# Patient Record
Sex: Male | Born: 1975 | Race: White | Hispanic: No | Marital: Married | Smoking: Never smoker
Health system: Southern US, Community
[De-identification: ages and names within clinical notes are randomized; demographics above are authoritative.]

## PROBLEM LIST (undated history)

## (undated) HISTORY — PX: NASAL SEPTUM SURGERY: SHX37

## (undated) HISTORY — PX: VASECTOMY: SHX75

## (undated) HISTORY — PX: OTHER SURGICAL HISTORY: SHX169

---

## 2014-05-12 ENCOUNTER — Emergency Department
Admission: EM | Admit: 2014-05-12 | Discharge: 2014-05-12 | Disposition: A | Discharge status: Home / Self Care | Payer: BC Managed Care – PPO | Source: Home / Self Care | Attending: Family Medicine | Admitting: Family Medicine

## 2014-05-12 ENCOUNTER — Emergency Department (INDEPENDENT_AMBULATORY_CARE_PROVIDER_SITE_OTHER): Payer: BC Managed Care – PPO

## 2014-05-12 ENCOUNTER — Telehealth: Payer: Self-pay | Admitting: *Deleted

## 2014-05-12 ENCOUNTER — Encounter: Payer: Self-pay | Admitting: Emergency Medicine

## 2014-05-12 DIAGNOSIS — M4040 Postural lordosis, site unspecified: Secondary | ICD-10-CM

## 2014-05-12 DIAGNOSIS — M25519 Pain in unspecified shoulder: Secondary | ICD-10-CM

## 2014-05-12 DIAGNOSIS — M5412 Radiculopathy, cervical region: Secondary | ICD-10-CM

## 2014-05-12 DIAGNOSIS — M2180 Other specified acquired deformities of unspecified limb: Secondary | ICD-10-CM

## 2014-05-12 DIAGNOSIS — M542 Cervicalgia: Secondary | ICD-10-CM

## 2014-05-12 DIAGNOSIS — R079 Chest pain, unspecified: Secondary | ICD-10-CM

## 2014-05-12 DIAGNOSIS — M5380 Other specified dorsopathies, site unspecified: Secondary | ICD-10-CM

## 2014-05-12 DIAGNOSIS — Z8781 Personal history of (healed) traumatic fracture: Secondary | ICD-10-CM

## 2014-05-12 MED ORDER — METAXALONE 800 MG PO TABS: 800.0000 mg | ORAL_TABLET | Freq: Three times a day (TID) | ORAL | Status: DC

## 2014-05-12 MED ORDER — HYDROCODONE-ACETAMINOPHEN 5-325 MG PO TABS: ORAL_TABLET | ORAL | Status: DC

## 2014-05-12 MED ORDER — PREDNISONE 20 MG PO TABS: 20.0000 mg | ORAL_TABLET | Freq: Two times a day (BID) | ORAL | Status: DC

## 2014-05-12 NOTE — Discharge Instructions (Signed)
Apply ice pack to right neck/shoulder 2 to 3 times daily.   Cervical Radiculopathy Cervical radiculopathy happens when a nerve in the neck is pinched or bruised by a slipped (herniated) disk or by arthritic changes in the bones of the cervical spine. This can occur due to an injury or as part of the normal aging process. Pressure on the cervical nerves can cause pain or numbness that runs from your neck all the way down into your arm and fingers. CAUSES  There are many possible causes, including:  Injury.  Muscle tightness in the neck from overuse.  Swollen, painful joints (arthritis).  Breakdown or degeneration in the bones and joints of the spine (spondylosis) due to aging.  Bone spurs that may develop near the cervical nerves. SYMPTOMS  Symptoms include pain, weakness, or numbness in the affected arm and hand. Pain can be severe or irritating. Symptoms may be worse when extending or turning the neck. DIAGNOSIS  Your caregiver will ask about your symptoms and do a physical exam. He or she may test your strength and reflexes. X-rays, CT scans, and MRI scans may be needed in cases of injury or if the symptoms do not go away after a period of time. Electromyography (EMG) or nerve conduction testing may be done to study how your nerves and muscles are working. TREATMENT  Your caregiver may recommend certain exercises to help relieve your symptoms. Cervical radiculopathy can, and often does, get better with time and treatment. If your problems continue, treatment options may include:  Wearing a soft collar for short periods of time.  Physical therapy to strengthen the neck muscles.  Medicines, such as nonsteroidal anti-inflammatory drugs (NSAIDs), oral corticosteroids, or spinal injections.  Surgery. Different types of surgery may be done depending on the cause of your problems. HOME CARE INSTRUCTIONS   Put ice on the affected area.  Put ice in a plastic bag.  Place a towel between  your skin and the bag.  Leave the ice on for 15-20 minutes, 03-04 times a day or as directed by your caregiver.  If ice does not help, you can try using heat. Take a warm shower or bath, or use a hot water bottle as directed by your caregiver.  You may try a gentle neck and shoulder massage.  Use a flat pillow when you sleep.  Only take over-the-counter or prescription medicines for pain, discomfort, or fever as directed by your caregiver.  If physical therapy was prescribed, follow your caregiver's directions.  If a soft collar was prescribed, use it as directed. SEEK IMMEDIATE MEDICAL CARE IF:   Your pain gets much worse and cannot be controlled with medicines.  You have weakness or numbness in your hand, arm, face, or leg.  You have a high fever or a stiff, rigid neck.  You lose bowel or bladder control (incontinence).  You have trouble with walking, balance, or speaking. MAKE SURE YOU:   Understand these instructions.  Will watch your condition.  Will get help right away if you are not doing well or get worse. Document Released: 08/21/2001 Document Revised: 02/18/2012 Document Reviewed: 07/10/2011 [facility] Patient Information 2014 [place], [place].

## 2014-05-12 NOTE — ED Notes (Signed)
Right shoulder pain x 1 month, radiates to back and right chest. Has tried chiropractor, PT, family doctor prescribed baclofen, no relief, can't get comfortable to sleep. 10/10

## 2014-05-12 NOTE — ED Provider Notes (Signed)
CSN: [id number]     Arrival date & time 05/12/14  1342 History   First MD Initiated Contact with Patient 05/12/14 1401     Chief Complaint  Patient presents with  . Shoulder Pain      HPI Comments: Patient awoke about one month ago with stiffness and pain in his right neck that has persisted and now radiates to his right shoulder. He recalls no injury or significant change in physical activities.  He visited his chiropracter twice without improvement.  He visited his PCP who prescribed baclofen, also resulting in no improvement.  He has pain when he flexes his neck laterally to the right, and over the past several days the pain has radiated to his right upper anterior chest and right arm.  He has intermittent tingling in his right first and second fingers.  He has difficulty sleeping.  He notes that he has had a dry cough for about two weeks.  No fevers, chills, and sweats.  He is not a smoker.  Patient is a 38 y.o. male presenting with shoulder pain. The history is provided by the patient.  Shoulder Pain This is a new problem. Episode onset: one month ago. The problem occurs constantly. The problem has been gradually worsening. Pertinent negatives include no headaches and no shortness of breath. Associated symptoms comments: Right neck pain and stiffness.  Cough.. Exacerbated by: flexing neck laterally toe the right. Nothing relieves the symptoms. Treatments tried: baclofen. The treatment provided no relief.    History reviewed. No pertinent past medical history. Past Surgical History  Procedure Laterality Date  . Nasal septum surgery     No family history on file. History  Substance Use Topics  . Smoking status: Never Smoker   . Smokeless tobacco: Not on file  . Alcohol Use: No    Review of Systems  Constitutional: Positive for fatigue. Negative for fever, chills and unexpected weight change.  HENT: Negative.   Eyes: Negative.   Respiratory: Positive for cough. Negative for shortness  of breath and wheezing.   Cardiovascular: Negative.   Gastrointestinal: Negative.   Genitourinary: Negative.   Musculoskeletal: Positive for neck pain.  Skin: Negative.   Neurological: Negative for headaches.    Allergies  Review of patient's allergies indicates no known allergies.  Home Medications   Prior to Admission medications   Medication Sig Start Date End Date Taking? Authorizing Provider  baclofen (LIORESAL) 10 MG tablet Take 10 mg by mouth 3 (three) times daily.   Yes Historical Provider, MD  HYDROcodone-acetaminophen (NORCO/VICODIN) 5-325 MG per tablet Take one by mouth at bedtime as needed for pain 05/12/14   Lattie HawStephen A Beese, MD  metaxalone (SKELAXIN) 800 MG tablet Take 1 tablet (800 mg total) by mouth 3 (three) times daily. 05/12/14   Lattie HawStephen A Beese, MD  predniSONE (DELTASONE) 20 MG tablet Take 1 tablet (20 mg total) by mouth 2 (two) times daily. Take with food. 05/12/14   Lattie HawStephen A Beese, MD   BP 128/79  Pulse 62  Temp(Src) 98.4 F (36.9 C) (Oral)  Ht 5\' 10"  (1.778 m)  Wt 181 lb (82.101 kg)  BMI 25.97 kg/m2  SpO2 99% Physical Exam  Nursing note and vitals reviewed. Constitutional: He is oriented to person, place, and time. He appears well-developed and well-nourished. No distress.  HENT:  Head: Normocephalic and atraumatic.  Nose: Nose normal.  Mouth/Throat: Oropharynx is clear and moist.  Eyes: Conjunctivae are normal. Pupils are equal, round, and reactive to light.  Neck:  Neck supple.    There is tenderness to palpation over the right neck and trapezius muscle as noted on diagram. Distal neurovascular function is intact.     Cardiovascular: Normal heart sounds.   Abdominal: There is no tenderness.  Musculoskeletal:       Right shoulder: He exhibits normal range of motion, no tenderness, no bony tenderness, no crepitus, normal pulse and normal strength.       Cervical back: He exhibits decreased range of motion and tenderness. He exhibits no bony tenderness.    Lymphadenopathy:    He has no cervical adenopathy.  Neurological: He is alert and oriented to person, place, and time.  Skin: Skin is warm and dry. No rash noted.    ED Course  Procedures  none     Imaging Review Dg Chest 2 View  05/12/2014   CLINICAL DATA:  Chest pain  EXAM: CHEST  2 VIEW  COMPARISON:  None.  FINDINGS: The lungs are clear. Heart size and pulmonary vascularity are normal. No adenopathy. No pneumothorax. There is evidence of an old fracture of the right clavicle with extensive remodeling in this area.  IMPRESSION: No edema or consolidation.   Electronically Signed   By: Bretta Bang M.D.   On: 05/12/2014 15:12   Dg Cervical Spine Complete  05/12/2014   CLINICAL DATA:  Right shoulder neck pain  EXAM: CERVICAL SPINE  4+ VIEWS  COMPARISON:  None.  FINDINGS: There is reversal normal cervical lordosis. No loss of vertebral body height. There is mild endplate spurring at C5-C6. Normal spinal laminal line. No subluxation. Open mouth odontoid view demonstrates normal alignment of the lateral masses of C1 on C2. The neural foramina difficult to evaluate due to the reversed lordosis.  IMPRESSION: 1. Reversal of the normal cervical lordosis may be secondary to position, muscle spasm, or ligamentous injury. 2. No acute findings otherwise in the cervical spine by plain film radiography.   Electronically Signed   By: Genevive Bi M.D.   On: 05/12/2014 15:13   Dg Shoulder Right  05/12/2014   CLINICAL DATA:  Two week history of right shoulder pain with numbness and tingling in the right hand.  EXAM: RIGHT SHOULDER - 2+ VIEW  COMPARISON:  None.  FINDINGS: There is an old right midshaft clavicular fracture which is healed with deformity. The [facility] joint is intact as is the glenohumeral joint. No acute fracture is demonstrated. The observed portions of the upper right ribs appear normal.  IMPRESSION: 1. There is no acute abnormality of the right glenohumeral or AC joints. 2. There is old  deformity of the midshaft of the right clavicle.   Electronically Signed   By: David  [place]   On: 05/12/2014 15:11     MDM   1. Neck pain on right side   2. Right cervical radiculopathy    Prednisone burst.  Skelaxin.  Lortab at bedtime. Apply ice pack to right neck/shoulder 2 to 3 times daily.  Avoid lifting and strenuous activities. Followup appt with Dr. Rodney Langton in 5 days.    Lattie Haw, MD 05/15/14 [phone]

## 2014-05-17 ENCOUNTER — Ambulatory Visit (INDEPENDENT_AMBULATORY_CARE_PROVIDER_SITE_OTHER): Payer: BC Managed Care – PPO | Admitting: Sports Medicine

## 2014-05-17 ENCOUNTER — Encounter: Payer: Self-pay | Admitting: Sports Medicine

## 2014-05-17 VITALS — BP 141/79 | HR 75 | Ht 70.0 in | Wt 179.0 lb

## 2014-05-17 DIAGNOSIS — M5412 Radiculopathy, cervical region: Secondary | ICD-10-CM

## 2014-05-17 MED ORDER — MELOXICAM 15 MG PO TABS: ORAL_TABLET | ORAL | Status: DC

## 2014-05-17 MED ORDER — CYCLOBENZAPRINE HCL 10 MG PO TABS: ORAL_TABLET | ORAL | Status: DC

## 2014-05-17 NOTE — Assessment & Plan Note (Signed)
Right-sided C7. He is already on prednisone in 2 weeks of physical therapy. Continue for an additional 2 weeks of PT, adding Flexeril and Mobic. Return to see me in 2 weeks, MRI for interventional injection planning if no better.

## 2014-05-17 NOTE — Progress Notes (Signed)
   Subjective:    I'm seeing this patient as a consultation for: Dr. Cathren Harsh    CC: Right shoulder pain  HPI: For the past month this pleasant 38 year-old male has experienced pain in his neck, right shoulder, radiating down the right arm in the C7 distribution causing numbness and tingling in the fingers. He has been on prednisone, and is currently 2 weeks and physical therapy improving only slightly. He's not taking any NSAIDs or muscle relaxers. Pain is moderate, persistent.  Past medical history, Surgical history, Family history not pertinant except as noted below, Social history, Allergies, and medications have been entered into the medical record, reviewed, and no changes needed.   Review of Systems: No headache, visual changes, nausea, vomiting, diarrhea, constipation, dizziness, abdominal pain, skin rash, fevers, chills, night sweats, weight loss, swollen lymph nodes, body aches, joint swelling, muscle aches, chest pain, shortness of breath, mood changes, visual or auditory hallucinations.   Objective:   General: Well Developed, well nourished, and in no acute distress.  Neuro/Psych: Alert and oriented x3, extra-ocular muscles intact, able to move all 4 extremities, sensation grossly intact. Skin: Warm and dry, no rashes noted.  Respiratory: Not using accessory muscles, speaking in full sentences, trachea midline.  Cardiovascular: Pulses palpable, no extremity edema. Abdomen: Does not appear distended. Neck: Inspection unremarkable. No palpable stepoffs. Negative Spurling's maneuver. Full neck range of motion Grip strength and sensation normal in bilateral hands Strength good C4 to T1 distribution No sensory change to C4 to T1 Negative Hoffman sign bilaterally Reflexes normal  Cervical spine x-rays show loss of the normal lordosis.  Impression and Recommendations:   This case required medical decision making of moderate complexity.

## 2014-05-31 ENCOUNTER — Ambulatory Visit (INDEPENDENT_AMBULATORY_CARE_PROVIDER_SITE_OTHER): Payer: BC Managed Care – PPO | Admitting: Sports Medicine

## 2014-05-31 ENCOUNTER — Encounter: Payer: Self-pay | Admitting: Sports Medicine

## 2014-05-31 VITALS — BP 118/80 | HR 81 | Ht 70.0 in | Wt 181.0 lb

## 2014-05-31 DIAGNOSIS — M5412 Radiculopathy, cervical region: Secondary | ICD-10-CM

## 2014-05-31 NOTE — Assessment & Plan Note (Signed)
Improved significantly, essentially pain-free. Still having some numbness, in a C6 this time, at the C7 distribution of the right hand. Physical therapy has progressed from passive range of motion and stretching, and he is now getting into active range of motion and strengthening. He continues to improve so we will hold off on MRI at this time. Continue additional 3 weeks of PT, return to see me in a month. Continue to avoid upper extremity exercises in the gym.

## 2014-05-31 NOTE — Progress Notes (Signed)
  Subjective:    CC: Followup  HPI: Right cervical radiculitis: At the last visit seemed predominantly C7, he is working with physical therapy and has improved significantly. Now has no pain, only some numbness but he now describes it mostly a C6 distribution, but a little to the C7 territory as well. He has finished with stretching, and passive range of motion, and they are now starting strengthening exercises with PT.  Past medical history, Surgical history, Family history not pertinant except as noted below, Social history, Allergies, and medications have been entered into the medical record, reviewed, and no changes needed.   Review of Systems: No fevers, chills, night sweats, weight loss, chest pain, or shortness of breath.   Objective:    General: Well Developed, well nourished, and in no acute distress.  Neuro: Alert and oriented x3, extra-ocular muscles intact, sensation grossly intact.  HEENT: Normocephalic, atraumatic, pupils equal round reactive to light, neck supple, no masses, no lymphadenopathy, thyroid nonpalpable.  Skin: Warm and dry, no rashes. Cardiac: Regular rate and rhythm, no murmurs rubs or gallops, no lower extremity edema.  Respiratory: Clear to auscultation bilaterally. Not using accessory muscles, speaking in full sentences. Neck: Inspection unremarkable. No palpable stepoffs. Negative Spurling's maneuver. Full neck range of motion Grip strength and sensation normal in bilateral hands Strength good C4 to T1 distribution No sensory change to C4 to T1 Negative Hoffman sign bilaterally Reflexes normal Impression and Recommendations:

## 2014-07-05 ENCOUNTER — Encounter: Payer: Self-pay | Admitting: Sports Medicine

## 2014-07-05 ENCOUNTER — Ambulatory Visit (INDEPENDENT_AMBULATORY_CARE_PROVIDER_SITE_OTHER): Payer: BC Managed Care – PPO | Admitting: Sports Medicine

## 2014-07-05 VITALS — BP 123/84 | HR 80 | Wt 177.0 lb

## 2014-07-05 DIAGNOSIS — M5412 Radiculopathy, cervical region: Secondary | ICD-10-CM | POA: Diagnosis not present

## 2014-07-05 NOTE — Progress Notes (Signed)
  Subjective:    CC: Followup  HPI: Right cervical radiculitis: Continues to improve, at this point is pain-free, with occasional numbness in a C7 versus a C6 distribution, continues to improve with physical therapy and does not desire to proceed any further with aggressive or interventional diagnostic or treatment.  Past medical history, Surgical history, Family history not pertinant except as noted below, Social history, Allergies, and medications have been entered into the medical record, reviewed, and no changes needed.   Review of Systems: No fevers, chills, night sweats, weight loss, chest pain, or shortness of breath.   Objective:    General: Well Developed, well nourished, and in no acute distress.  Neuro: Alert and oriented x3, extra-ocular muscles intact, sensation grossly intact.  HEENT: Normocephalic, atraumatic, pupils equal round reactive to light, neck supple, no masses, no lymphadenopathy, thyroid nonpalpable.  Skin: Warm and dry, no rashes. Cardiac: Regular rate and rhythm, no murmurs rubs or gallops, no lower extremity edema.  Respiratory: Clear to auscultation bilaterally. Not using accessory muscles, speaking in full sentences. Neck: Negative spurling's Full neck range of motion Grip strength and sensation normal in bilateral hands Strength good C4 to T1 distribution No sensory change to C4 to T1 Reflexes normal  Impression and Recommendations:

## 2014-07-05 NOTE — Assessment & Plan Note (Signed)
Symptoms are all but resolved. Continues to improve with PT. Only occasionally gets some tingling in his right hand in a C6 versus a C7 distribution that improves with correction of posture. Return in 3 months, if he continues to have symptoms and does desire to proceed to MRI/epidural he can give me a call and I would be happy to simply ordered the MRI. He will followup with me to go over the results. Return to see me in 3 months.

## 2014-10-05 ENCOUNTER — Encounter: Payer: Self-pay | Admitting: Sports Medicine

## 2014-10-05 ENCOUNTER — Ambulatory Visit (INDEPENDENT_AMBULATORY_CARE_PROVIDER_SITE_OTHER): Payer: BC Managed Care – PPO | Admitting: Sports Medicine

## 2014-10-05 VITALS — BP 118/70 | HR 66 | Wt 178.0 lb

## 2014-10-05 DIAGNOSIS — M5412 Radiculopathy, cervical region: Secondary | ICD-10-CM

## 2014-10-05 NOTE — Progress Notes (Signed)
  Subjective:    CC: follow-up  HPI: Right cervical radiculitis: Symptoms have completely resolved now after 4 months of conservative measures. Happy with results so far.  Past medical history, Surgical history, Family history not pertinant except as noted below, Social history, Allergies, and medications have been entered into the medical record, reviewed, and no changes needed.   Review of Systems: No fevers, chills, night sweats, weight loss, chest pain, or shortness of breath.   Objective:    General: Well Developed, well nourished, and in no acute distress.  Neuro: Alert and oriented x3, extra-ocular muscles intact, sensation grossly intact.  HEENT: Normocephalic, atraumatic, pupils equal round reactive to light, neck supple, no masses, no lymphadenopathy, thyroid nonpalpable.  Skin: Warm and dry, no rashes. Cardiac: Regular rate and rhythm, no murmurs rubs or gallops, no lower extremity edema.  Respiratory: Clear to auscultation bilaterally. Not using accessory muscles, speaking in full sentences. Neck: Negative spurling's Full neck range of motion Grip strength and sensation normal in bilateral hands Strength good C4 to T1 distribution No sensory change to C4 to T1 Reflexes normal  Impression and Recommendations:

## 2014-10-05 NOTE — Assessment & Plan Note (Signed)
Symptoms completely resolved. Return as needed.

## 2015-02-28 ENCOUNTER — Emergency Department
Admission: EM | Admit: 2015-02-28 | Discharge: 2015-02-28 | Disposition: A | Discharge status: Home / Self Care | Payer: BLUE CROSS/BLUE SHIELD | Source: Home / Self Care | Attending: Family Medicine | Admitting: Family Medicine

## 2015-02-28 ENCOUNTER — Encounter: Payer: Self-pay | Admitting: Emergency Medicine

## 2015-02-28 DIAGNOSIS — Z779 Other contact with and (suspected) exposures hazardous to health: Secondary | ICD-10-CM | POA: Diagnosis not present

## 2015-02-28 DIAGNOSIS — Z77098 Contact with and (suspected) exposure to other hazardous, chiefly nonmedicinal, chemicals: Secondary | ICD-10-CM

## 2015-02-28 MED ORDER — PREDNISOLONE ACETATE 1 % OP SUSP: 1.0000 [drp] | Freq: Four times a day (QID) | OPHTHALMIC | Status: DC

## 2015-02-28 NOTE — ED Provider Notes (Signed)
CSN: [id number]     Arrival date & time 02/28/15  1847 History   First MD Initiated Contact with Patient 02/28/15 1921     Chief Complaint  Patient presents with  . Foreign Body in Eye      HPI Comments: Patient was helping to move an empty container that had held 12% sodium hypochlorite solution when a hose suddenly came loose, and a single drop of the solution contacted his left eye.  He immediately flushed his left eye with water for about 10 to 15 minutes, then lavaged his left eye with an eye wash solution. He now complains of a sensation of dryness in his left eye but no pain, and no foreign body sensation.  Patient is a 39 y.o. male presenting with eye problem. The history is provided by the patient.  Eye Problem Location:  L eye Quality: dry sensation. Severity:  Mild Onset quality:  Sudden Duration:  8 hours Timing:  Constant Progression:  Improving Chronicity:  New Context: chemical exposure   Relieved by:  Eye drops Worsened by:  Bright light Associated symptoms: photophobia and redness   Associated symptoms: no blurred vision, no decreased vision, no discharge, no double vision, no facial rash, no foreign body sensation, no headaches, no itching, no swelling and no tearing     History reviewed. No pertinent past medical history. Past Surgical History  Procedure Laterality Date  . Nasal septum surgery     History reviewed. No pertinent family history. History  Substance Use Topics  . Smoking status: Never Smoker   . Smokeless tobacco: Not on file  . Alcohol Use: No    Review of Systems  Eyes: Positive for photophobia and redness. Negative for blurred vision, double vision, discharge and itching.  Neurological: Negative for headaches.  All other systems reviewed and are negative.   Allergies  Review of patient's allergies indicates not on file.  Home Medications   Prior to Admission medications   Medication Sig Start Date End Date Taking? Authorizing  Provider  prednisoLONE acetate (PRED FORTE) 1 % ophthalmic suspension Place 1 drop into the left eye 4 (four) times daily. 02/28/15   Lattie HawStephen A Beese, MD   BP 126/85 mmHg  Pulse 60  Temp(Src) 98.3 F (36.8 C) (Oral)  Ht 5\' 10"  (1.778 m)  Wt 170 lb (77.111 kg)  BMI 24.39 kg/m2  SpO2 100% Physical Exam  Constitutional: He appears well-developed and well-nourished. No distress.  HENT:  Head: Normocephalic and atraumatic.  Right Ear: External ear normal.  Left Ear: External ear normal.  Nose: Nose normal.  Mouth/Throat: Oropharynx is clear and moist.  Eyes: EOM and lids are normal. Pupils are equal, round, and reactive to light. Right eye exhibits no chemosis and no discharge. Left eye exhibits no chemosis and no discharge.  Left conjunctivae minimally injected.  No photophobia.  Fluorescein to left eye shows no uptake.  Nursing note and vitals reviewed.   ED Course  Procedures  none   MDM   1. Chemical exposure of eye; normal eye exam    Begin prednisolone 1% ophth suspension, 1gtt QID (do not use more than 3 days). May apply lubricating eye drops (such as "Refresh" tears) several times daily. Followup with ophthalmologist tomorrow.    Lattie HawStephen A Beese, MD 03/02/15 [phone]

## 2015-02-28 NOTE — Discharge Instructions (Signed)
May apply lubricating eye drops (such as "Refresh" tears) several times daily.

## 2015-02-28 NOTE — ED Notes (Signed)
Bleach in left eye today around 11 am, one drop splashed, he flused with water for 10-15 mins, then used an eye wash

## 2015-03-02 ENCOUNTER — Telehealth: Payer: Self-pay | Admitting: *Deleted

## 2015-07-29 ENCOUNTER — Ambulatory Visit (INDEPENDENT_AMBULATORY_CARE_PROVIDER_SITE_OTHER): Payer: BLUE CROSS/BLUE SHIELD | Admitting: Sports Medicine

## 2015-07-29 ENCOUNTER — Encounter: Payer: Self-pay | Admitting: Sports Medicine

## 2015-07-29 DIAGNOSIS — M5412 Radiculopathy, cervical region: Secondary | ICD-10-CM

## 2015-07-29 MED ORDER — DIAZEPAM 5 MG PO TABS: ORAL_TABLET | ORAL | Status: DC

## 2015-07-29 MED ORDER — PREDNISONE 50 MG PO TABS
ORAL_TABLET | ORAL | Status: DC
Start: 2015-07-29 — End: 2015-08-03

## 2015-07-29 NOTE — Assessment & Plan Note (Signed)
Did well last year, unfortunately has been doing physical therapy again but still has a recurrence of right-sided C5 periscapular as well as minimal C7 radiculopathy. 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 Prednisone, MRI for interventional planning, return to see me for MRI results, Valium for preprocedural anxiolysis.

## 2015-07-29 NOTE — Progress Notes (Signed)
  Subjective:    CC: follow-up  HPI: Right cervical radiculopathy: Did well last year with formal physical therapy and oral medications, unfortunately having a recurrence of symptoms, he has already placed him through a good month of additional physical therapy, radicular symptoms are right periscapular as well as a bit of C7 distribution to the second and third fingers. Symptoms are moderate, persistent.  Past medical history, Surgical history, Family history not pertinant except as noted below, Social history, Allergies, and medications have been entered into the medical record, reviewed, and no changes needed.   Review of Systems: No fevers, chills, night sweats, weight loss, chest pain, or shortness of breath.   Objective:    General: Well Developed, well nourished, and in no acute distress.  Neuro: Alert and oriented x3, extra-ocular muscles intact, sensation grossly intact.  HEENT: Normocephalic, atraumatic, pupils equal round reactive to light, neck supple, no masses, no lymphadenopathy, thyroid nonpalpable.  Skin: Warm and dry, no rashes. Cardiac: Regular rate and rhythm, no murmurs rubs or gallops, no lower extremity edema.  Respiratory: Clear to auscultation bilaterally. Not using accessory muscles, speaking in full sentences. Neck: Negative spurling's Full neck range of motion Grip strength and sensation normal in bilateral hands Strength good C4 to T1 distribution No sensory change to C4 to T1 Reflexes normal  Impression and Recommendations:    I spent 25 minutes with this patient, greater than 50% was face-to-face time counseling regarding the above diagnoses

## 2015-08-01 ENCOUNTER — Ambulatory Visit (INDEPENDENT_AMBULATORY_CARE_PROVIDER_SITE_OTHER): Payer: BLUE CROSS/BLUE SHIELD

## 2015-08-01 DIAGNOSIS — M5032 Other cervical disc degeneration, mid-cervical region: Secondary | ICD-10-CM

## 2015-08-01 DIAGNOSIS — M5412 Radiculopathy, cervical region: Secondary | ICD-10-CM

## 2015-08-03 ENCOUNTER — Ambulatory Visit (INDEPENDENT_AMBULATORY_CARE_PROVIDER_SITE_OTHER): Payer: BLUE CROSS/BLUE SHIELD | Admitting: Sports Medicine

## 2015-08-03 ENCOUNTER — Encounter: Payer: Self-pay | Admitting: Sports Medicine

## 2015-08-03 DIAGNOSIS — M5412 Radiculopathy, cervical region: Secondary | ICD-10-CM

## 2015-08-03 NOTE — Assessment & Plan Note (Signed)
MRI does show a C6-C7 right-sided disc protrusion causing clear foraminal stenosis of the right greater than left exiting C7 nerve roots. At this point Joseph Gutierrez has been through 2, several months episodes of physical therapy. Oral medications. We are going to proceed at this point with interventional treatment. This will take place as a right C6-C7 interlaminar epidural. Return to see me one month after the injection to evaluate response.

## 2015-08-03 NOTE — Progress Notes (Signed)
  Subjective:    CC: MRI results  HPI: Joseph Gutierrez returns for follow-up of a right C7 radiculopathy, he has been through 2 months of formal physical therapy, steroids, NSAIDs, unfortunately has continued to have pain. Denies any weakness, no lower acuity symptoms, no bowel or bladder dysfunction. No constitutional symptoms. Symptoms are moderate, persistent.  Past medical history, Surgical history, Family history not pertinant except as noted below, Social history, Allergies, and medications have been entered into the medical record, reviewed, and no changes needed.   Review of Systems: No fevers, chills, night sweats, weight loss, chest pain, or shortness of breath.   Objective:    General: Well Developed, well nourished, and in no acute distress.  Neuro: Alert and oriented x3, extra-ocular muscles intact, sensation grossly intact.  HEENT: Normocephalic, atraumatic, pupils equal round reactive to light, neck supple, no masses, no lymphadenopathy, thyroid nonpalpable.  Skin: Warm and dry, no rashes. Cardiac: Regular rate and rhythm, no murmurs rubs or gallops, no lower extremity edema.  Respiratory: Clear to auscultation bilaterally. Not using accessory muscles, speaking in full sentences.  MRI personally reviewed and the dominant finding shows a right-sided by foraminal right worse than left C6-C7 disc protrusion clearly affecting the exiting right C7 nerve root.  Impression and Recommendations:   I spent 25 minutes with this patient, greater than 50% was face-to-face time counseling regarding the above diagnoses

## 2015-08-09 ENCOUNTER — Ambulatory Visit
Admission: RE | Admit: 2015-08-09 | Discharge: 2015-08-09 | Disposition: A | Discharge status: Home / Self Care | Payer: BLUE CROSS/BLUE SHIELD | Source: Ambulatory Visit | Attending: Sports Medicine | Admitting: Sports Medicine

## 2015-08-09 MED ORDER — IOHEXOL 300 MG/ML  SOLN
1.0000 mL | Freq: Once | INTRAMUSCULAR | Status: DC | PRN
  Administered 2015-08-09: 1 mL via EPIDURAL

## 2015-08-09 MED ORDER — TRIAMCINOLONE ACETONIDE 40 MG/ML IJ SUSP (RADIOLOGY)
60.0000 mg | Freq: Once | INTRAMUSCULAR | Status: AC
  Administered 2015-08-09: 60 mg via EPIDURAL

## 2015-08-09 NOTE — Discharge Instructions (Signed)

## 2015-08-11 ENCOUNTER — Telehealth: Payer: Self-pay | Admitting: Radiology

## 2015-08-11 NOTE — Telephone Encounter (Signed)
Pt had an injection on Tues. In his cervical region. This morning having spasm in neck and has taken muscle relaxer. I also suggested alternating ice and heat on his neck to help the spasm.

## 2015-08-14 ENCOUNTER — Emergency Department (HOSPITAL_COMMUNITY)
Admission: EM | Admit: 2015-08-14 | Discharge: 2015-08-14 | Disposition: A | Discharge status: Home / Self Care | Payer: BLUE CROSS/BLUE SHIELD | Attending: Emergency Medicine | Admitting: Emergency Medicine

## 2015-08-14 ENCOUNTER — Emergency Department (HOSPITAL_COMMUNITY): Payer: BLUE CROSS/BLUE SHIELD

## 2015-08-14 ENCOUNTER — Encounter (HOSPITAL_COMMUNITY): Payer: Self-pay | Admitting: *Deleted

## 2015-08-14 DIAGNOSIS — R51 Headache: Secondary | ICD-10-CM | POA: Diagnosis present

## 2015-08-14 DIAGNOSIS — Z79899 Other long term (current) drug therapy: Secondary | ICD-10-CM | POA: Diagnosis not present

## 2015-08-14 DIAGNOSIS — G971 Other reaction to spinal and lumbar puncture: Secondary | ICD-10-CM

## 2015-08-14 DIAGNOSIS — G9600 Cerebrospinal fluid leak, unspecified: Secondary | ICD-10-CM

## 2015-08-14 LAB — BASIC METABOLIC PANEL
Anion gap: 4 — ABNORMAL LOW (ref 5–15)
BUN: 8 mg/dL (ref 6–20)
CO2: 27 mmol/L (ref 22–32)
CREATININE: 0.95 mg/dL (ref 0.61–1.24)
Calcium: 9 mg/dL (ref 8.9–10.3)
Chloride: 107 mmol/L (ref 101–111)
GFR calc Af Amer: >60 mL/min (ref >60)
Glucose, Bld: 104 mg/dL — ABNORMAL HIGH (ref 65–99)
POTASSIUM: 3.8 mmol/L (ref 3.5–5.1)
Sodium: 138 mmol/L (ref 135–145)

## 2015-08-14 LAB — CBC WITH DIFFERENTIAL/PLATELET
Basophils Absolute: 0.1 10*3/uL (ref 0.0–0.1)
Basophils Relative: 1 % (ref 0–1)
Eosinophils Absolute: 0 10*3/uL (ref 0.0–0.7)
Eosinophils Relative: 1 % (ref 0–5)
HCT: 46 % (ref 39.0–52.0)
Hemoglobin: 15.8 g/dL (ref 13.0–17.0)
LYMPHS ABS: 1.4 10*3/uL (ref 0.7–4.0)
Lymphocytes Relative: 16 % (ref 12–46)
MCH: 31.2 pg (ref 26.0–34.0)
MCHC: 34.3 g/dL (ref 30.0–36.0)
MCV: 90.9 fL (ref 78.0–100.0)
Monocytes Absolute: 0.9 10*3/uL (ref 0.1–1.0)
Monocytes Relative: 10 % (ref 3–12)
Neutro Abs: 6.5 10*3/uL (ref 1.7–7.7)
Neutrophils Relative %: 74 % (ref 43–77)
PLATELETS: 276 10*3/uL (ref 150–400)
RBC: 5.06 MIL/uL (ref 4.22–5.81)
RDW: 11.9 % (ref 11.5–15.5)
WBC: 8.9 10*3/uL (ref 4.0–10.5)

## 2015-08-14 MED ORDER — CAFFEINE-SODIUM BENZOATE 125-125 MG/ML IJ SOLN
500.0000 mg | Freq: Once | INTRAMUSCULAR | Status: AC
  Administered 2015-08-14: 500 mg via INTRAVENOUS
  Filled 2015-08-14: qty 2

## 2015-08-14 MED ORDER — LORAZEPAM 2 MG/ML IJ SOLN
1.0000 mg | Freq: Once | INTRAMUSCULAR | Status: AC
  Administered 2015-08-14: 1 mg via INTRAVENOUS
  Filled 2015-08-14: qty 1

## 2015-08-14 MED ORDER — OXYCODONE-ACETAMINOPHEN 5-325 MG PO TABS: 1.0000 | ORAL_TABLET | ORAL | Status: DC | PRN

## 2015-08-14 MED ORDER — GADOBENATE DIMEGLUMINE 529 MG/ML IV SOLN: 17.0000 mL | Freq: Once | INTRAVENOUS | Status: DC | PRN

## 2015-08-14 MED ORDER — GADOBENATE DIMEGLUMINE 529 MG/ML IV SOLN
17.0000 mL | Freq: Once | INTRAVENOUS | Status: AC | PRN
  Administered 2015-08-14: 17 mL via INTRAVENOUS

## 2015-08-14 NOTE — ED Notes (Signed)
Pt states he had an epidural injection of Kenolog to tx pain r/t C6-C7 herniated disc pain. Procedure was on Tues and all his chronic pain was alleviated.  Wed he began having a severe headache that is increasing.  Pt is also nauseated and photophobic.  Denies fevers and blurred vision.

## 2015-08-14 NOTE — ED Notes (Signed)
Pt transporting to MRI at this time. NAD. 

## 2015-08-14 NOTE — ED Notes (Signed)
Pt in MRI.

## 2015-08-14 NOTE — ED Provider Notes (Signed)
CSN: [id number]     Arrival date & time 08/14/15  1153 History   First MD Initiated Contact with Patient 08/14/15 1459     Chief Complaint  Patient presents with  . Headache   HPI   39 year old male presents today with a headache. Patient reports that on 08/09/2015( 5 days ago he had a cervical epidural. First attempt produced contrast injection up into the subarachnoid space, second attempt was successful. Patient reports his radicular symptoms were dramatically improved after the injection, no headache at that time. He reports that over the evening and neck several days he developed postural headache reporting that when he sits up he gets severe pain in his neck and trapezius with muscle spasms. He reports this is improved with lying flat. Patient denies any neurological deficits including changes in his vision, smell, taste, sensation, strength. Patient is able to ambulate without difficulty, 5 out of 5 strength. Patient has full active range of motion of the neck back hips. Patient denies fever, chills, nausea, vomiting, dizziness, abdominal pain, lower extremity swelling or edema. Patient reports normal bowel bladder functioning characteristics. Patient reports trying Tylenol at home with no relief of headache. Patient reports that he contacted the on-call neuroradiologist instructed to come to the emergency room for MRI study.      History reviewed. No pertinent past medical history. Past Surgical History  Procedure Laterality Date  . Nasal septum surgery    . Vasectomy     No family history on file. Social History  Substance Use Topics  . Smoking status: Never Smoker   . Smokeless tobacco: None  . Alcohol Use: No    Review of Systems  All other systems reviewed and are negative.  Allergies  Prednisone  Home Medications   Prior to Admission medications   Medication Sig Start Date End Date Taking? Authorizing Provider  acetaminophen (TYLENOL) 325 MG tablet Take 650 mg by mouth  every 6 (six) hours as needed for moderate pain.   Yes Historical Provider, MD  diazepam (VALIUM) 5 MG tablet Take 5 mg by mouth every 6 (six) hours as needed for anxiety.   Yes Historical Provider, MD  ibuprofen (ADVIL,MOTRIN) 200 MG tablet Take 400 mg by mouth every 6 (six) hours as needed for moderate pain.    Yes Historical Provider, MD  metaxalone (SKELAXIN) 800 MG tablet Take 800 mg by mouth 3 (three) times daily as needed for muscle spasms.   Yes Historical Provider, MD  oxyCODONE-acetaminophen (PERCOCET/ROXICET) 5-325 MG per tablet Take 1 tablet by mouth every 4 (four) hours as needed for severe pain. 08/14/15   Weltha Cathy, PA-C   BP 123/74 mmHg  Pulse 72  Temp(Src) 98.7 F (37.1 C) (Oral)  Resp 12  Ht 5\' 10"  (1.778 m)  Wt 175 lb (79.379 kg)  BMI 25.11 kg/m2  SpO2 99%   Physical Exam  Constitutional: He is oriented to person, place, and time. He appears well-developed and well-nourished.  Pt laying flat on bed  HENT:  Head: Normocephalic and atraumatic.  Eyes: Conjunctivae are normal. Pupils are equal, round, and reactive to light. Right eye exhibits no discharge. Left eye exhibits no discharge. No scleral icterus.  Neck: Normal range of motion. Neck supple. No JVD present. No tracheal deviation present.  No sign of abscess, infection, swelling of the neck. Nontender to palpation full active range of motion  Cardiovascular: Normal rate, regular rhythm, normal heart sounds and intact distal pulses.  Exam reveals no gallop and no friction  rub.   No murmur heard. Pulmonary/Chest: Effort normal and breath sounds normal. No stridor. No respiratory distress. He has no wheezes. He has no rales. He exhibits no tenderness.  Musculoskeletal: Normal range of motion. He exhibits no edema or tenderness.  Lymphadenopathy:    He has no cervical adenopathy.  Neurological: He is alert and oriented to person, place, and time. Coordination normal.  Skin: Skin is warm and dry.  Psychiatric: He  has a normal mood and affect. His behavior is normal. Judgment and thought content normal.  Nursing note and vitals reviewed.     ED Course  Procedures (including critical care time) Labs Review Labs Reviewed  BASIC METABOLIC PANEL - Abnormal; Notable for the following:    Glucose, Bld 104 (*)    Anion gap 4 (*)    All other components within normal limits  CBC WITH DIFFERENTIAL/PLATELET    Imaging Review Mr Lodema Pilot Contrast  08/14/2015   CLINICAL DATA:  39 year old male with increasing pain following cervical spine steroid injection several days ago. Initial encounter.  EXAM: MRI HEAD WITHOUT AND WITH CONTRAST  TECHNIQUE: Multiplanar, multiecho pulse sequences of the brain and surrounding structures were obtained without and with intravenous contrast.  CONTRAST:  17 mL MultiHance in conjunction with contrast enhanced imaging of the cervical spine reported separately.  COMPARISON:  Cervical spine MRI from today reported separately.  FINDINGS: Cerebral volume is normal. Basilar cisterns are normally patent. Normal cervicomedullary junction.  No restricted diffusion to suggest acute infarction. No midline shift, mass effect, evidence of mass lesion, ventriculomegaly, extra-axial collection or acute intracranial hemorrhage. Pituitary within normal limits. Major intracranial vascular flow voids are preserved.  Wallace Cullens and white matter signal is within normal limits throughout the brain. No abnormal enhancement identified. No dural or meningeal thickening or hyper enhancement.  Visible internal auditory structures appear normal. Mastoids are clear. Small maxillary sinus mucous retention cyst. Orbit and scalp soft tissues appear normal. Normal bone marrow signal.  Cervical spine today reported separately.  IMPRESSION: 1.  Normal MRI appearance of the brain. 2. Cervical spine MRI from today reported separately.   Electronically Signed   By: Odessa Fleming M.D.   On: 08/14/2015 18:45   Mr Cervical Spine W Wo  Contrast  08/14/2015   CLINICAL DATA:  39 year old male status post cervical spine steroid injection for pain 5 days ago with new onset different neck pain and headache which is increasing. Nausea and photophobia. Initial encounter.  EXAM: MRI CERVICAL SPINE WITHOUT AND WITH CONTRAST  TECHNIQUE: Multiplanar and multiecho pulse sequences of the cervical spine, to include the craniocervical junction and cervicothoracic junction, were obtained according to standard protocol without and with intravenous contrast.  CONTRAST:  17mL MULTIHANCE GADOBENATE DIMEGLUMINE 529 MG/ML IV SOLN  COMPARISON:  Preprocedural cervical spine MRI 08/01/2015.  FINDINGS: Stable cervical vertebral height and alignment. Stable bone marrow signal. No marrow edema or vertebral enhancement to suggest acute osseous abnormality.  Cervicomedullary junction appears stable and normal. No cervical spinal cord signal abnormality or abnormal enhancement.  Best seen on axial images, there appears to be increased epidural fluid or soft tissue about the cervical spine thecal sac, most pronounced at the C4 through C6 level. This is eccentric to the right involving the lateral and posterior extradural space (series 7, image 14). The material is increased on T2, decreased to isointense on T1 precontrast, and enhancing post-contrast (series 10, image 14). This hyper enhancement is contiguous with the normal cervical venous plexus enhancement. There  is subsequent effacement of the CSF from around the spinal cord at these levels.  The findings abates at the cervicothoracic junction, an the visualized upper thoracic spine appears normal. There is no associated pachymeningeal thickening or enhancement.  The other cervical paraspinal soft tissues appear stable and within normal limits. No prevertebral fluid or edema. Vertebral artery flow voids appear stable. Negative lung apices.  Cervical spine degenerative changes are stable.  IMPRESSION: 1. New extradural fluid  and/or inflammation along the right and posterior cervical thecal sac from the C3-C4 to the C6-C7 level. Homogeneous appearing enhancement of this material which is contiguous with the normal vertebral venous plexus enhancement. Mild effacement of CSF from the thecal sac at these levels. No spinal cord compression or cord signal abnormality. 2. Top differential considerations in this setting include CSF leak and/or epidural inflammation from either chemical irritation (favored) or infection (less likely). 3. No acute osseous abnormality. No pachymeningeal thickening or enhancement. Stable cervical spine degeneration.   Electronically Signed   By: Odessa Fleming M.D.   On: 08/14/2015 19:07   I have personally reviewed and evaluated these images and lab results as part of my medical decision-making.   EKG Interpretation None      MDM   Final diagnoses:  CSF leak    Labs: CBC, BMP- no significant findings  Imaging: MRI brain, MRI cervical spine-see above  Consults: Radiology Dr. Chestine Spore, neurosurgery Dr. Bevely Palmer  Therapeutics: Ativan  Discharge Meds:   Assessment/Plan: Patient's presentation likely represents a CSF leak. Patient is asymptomatic while lying flat, has headache when sitting up. I consult at both neuroradiology who instructed to have MRI study for further evaluation. I consult did neurosurgery with the results of those who encouraged me to have patient follow-up with his radiologist to perform the procedure for a potential blood patch. The patient was unable to receive this from the radiologist he is to follow-up with neurosurgery in clinic for evaluation and management. Patient will be discharged home with pain medication, and strict return precautions event new or worsening signs or symptoms present. Patient is afebrile, vital signs stable, he is full active range of motion of his neck, this is unlikely infectious, MRI of the brain showed no significant findings. Patient verbalized  understanding and agreement for today's plan and had no further questions or concerns at the time of discharge.         Eyvonne Mechanic, PA-C 08/15/15 1610  Glynn Octave, MD 08/15/15 [phone]

## 2015-08-14 NOTE — Discharge Instructions (Signed)
Please contact radiologist first thing Tuesday morning and inform them of your visit, please request immediate follow-up evaluation for further management. If you're unable to make contact with them please contact neurosurgeon, his contact information is attached to this paperwork. If new or worsening signs or symptoms present please return emergently to the emergency room.

## 2015-08-14 NOTE — ED Notes (Signed)
PA at bedside.

## 2015-08-16 ENCOUNTER — Telehealth: Payer: Self-pay

## 2015-08-16 NOTE — Telephone Encounter (Signed)
Joseph Gutierrez was seen in the ED for CSF leak. He needs a blood patch and wanted [place] to call Dr Alfredo Batty with [facility] since he was the one that did the epidural. Please advise.

## 2015-08-16 NOTE — Telephone Encounter (Signed)
Discussed in person with patient. Previous radicular pain is completely gone, and post-lumbar puncture headache type pain is improving. No constitutional symptoms.

## 2015-08-18 ENCOUNTER — Telehealth: Payer: Self-pay | Admitting: Radiology

## 2015-08-18 NOTE — Telephone Encounter (Signed)
I spoke with [name] this morning.  He informed me that his neck pain and headaches have continued to resolve.  His radicular pains had resolved within 4 hours of the initial injection 9 days ago.  He stated that he was comforted by the level of communication he observed both before, during and after his emergency room visit 08/14/15.  I reassured him that I was in communication with the radiologists who were reading his exams and communicating with the ER staff.  I had reviewed his MRI Brain & C-spine personally on 12/4.  I answered all of his questions and reassured him that we were available for any ongoing or future concerns regarding his neck pain or radicular symptoms.  I'm glad to hear that the radicular symptoms have resolved and his headaches and neck pain are near completely resolved.  I am confident he will continue to feel better as the CSF leak resolves.  I would be delighted to participate again in the care of Joseph Gutierrez in any way I can help.

## 2015-08-31 ENCOUNTER — Ambulatory Visit (INDEPENDENT_AMBULATORY_CARE_PROVIDER_SITE_OTHER): Payer: BLUE CROSS/BLUE SHIELD | Admitting: Sports Medicine

## 2015-08-31 ENCOUNTER — Encounter: Payer: Self-pay | Admitting: Sports Medicine

## 2015-08-31 VITALS — BP 126/85 | HR 84 | Wt 177.0 lb

## 2015-08-31 DIAGNOSIS — M5412 Radiculopathy, cervical region: Secondary | ICD-10-CM

## 2015-08-31 NOTE — Assessment & Plan Note (Signed)
Doing extremely well post epidural, this was complicated by dural puncture and cerebral spinal fluid leak, however this was mild, and has continued to resolve to the point where he is asymptomatic with regards to headaches and axial pain, and had good resolution of radicular pain immediately after the epidural. No further management or intervention needed, he understands the statistics behind dural puncture and cerebrospinal fluid leak with epidurals, and he is amenable to do an additional epidural should his radicular or axial pain return. He may return to see me on an as needed basis.

## 2015-08-31 NOTE — Progress Notes (Signed)
  Subjective:    CC: Follow-up after epidural  HPI: Amer returns, he had a cervical epidural, unfortunately this was, located by dural puncture and a cerebrospinal fluid leak, he never needed a blood patch and has done extremely well with resolution of headaches, and resolution axial and radicular pain immediately after the epidural, happy with current results, he does understand the minimal statistical significance of a CSF leak, and would be happy to proceed with an additional epidural should his symptoms return.  Past medical history, Surgical history, Family history not pertinant except as noted below, Social history, Allergies, and medications have been entered into the medical record, reviewed, and no changes needed.   Review of Systems: No fevers, chills, night sweats, weight loss, chest pain, or shortness of breath.   Objective:    General: Well Developed, well nourished, and in no acute distress.  Neuro: Alert and oriented x3, extra-ocular muscles intact, sensation grossly intact.  HEENT: Normocephalic, atraumatic, pupils equal round reactive to light, neck supple, no masses, no lymphadenopathy, thyroid nonpalpable.  Skin: Warm and dry, no rashes. Cardiac: Regular rate and rhythm, no murmurs rubs or gallops, no lower extremity edema.  Respiratory: Clear to auscultation bilaterally. Not using accessory muscles, speaking in full sentences. Neck: Negative spurling's Full neck range of motion Grip strength and sensation normal in bilateral hands Strength good C4 to T1 distribution No sensory change to C4 to T1 Reflexes normal  Impression and Recommendations:

## 2015-10-19 ENCOUNTER — Encounter: Payer: Self-pay | Admitting: *Deleted

## 2015-10-19 ENCOUNTER — Emergency Department
Admission: EM | Admit: 2015-10-19 | Discharge: 2015-10-19 | Disposition: A | Discharge status: Home / Self Care | Payer: BLUE CROSS/BLUE SHIELD | Source: Home / Self Care | Attending: Family Medicine | Admitting: Family Medicine

## 2015-10-19 DIAGNOSIS — Z20828 Contact with and (suspected) exposure to other viral communicable diseases: Secondary | ICD-10-CM

## 2015-10-19 DIAGNOSIS — R6889 Other general symptoms and signs: Secondary | ICD-10-CM | POA: Diagnosis not present

## 2015-10-19 MED ORDER — OSELTAMIVIR PHOSPHATE 75 MG PO CAPS: 75.0000 mg | ORAL_CAPSULE | Freq: Two times a day (BID) | ORAL | Status: DC

## 2015-10-19 MED ORDER — PROMETHAZINE HCL 25 MG PO TABS: 25.0000 mg | ORAL_TABLET | Freq: Four times a day (QID) | ORAL | Status: DC | PRN

## 2015-10-19 NOTE — ED Provider Notes (Signed)
CSN: [id number]     Arrival date & time 10/19/15  1255 History   None    Chief Complaint  Patient presents with  . Nasal Congestion  . Generalized Body Aches   (Consider location/radiation/quality/duration/timing/severity/associated sxs/prior Treatment) HPI Pt is a 39yo male presenting to [facility] with c/o sudden onset nasal congestion with post-nasal drip, sore throat, body aches with neck soreness and fatigue for 1 day.  Pt reports his son was dx with the flu this weekend and started having vomiting and diarrhea today.  Pt was not started on any prophylactic medication by his son's provider this weekend.  Per medical records, pt did have a headache and neck pain secondary to CSF leak due to a procedure but states those symptoms resolved on their own completely without a blood patch.  He did not receive the flu vaccine this year. Denies n/v/d. Denies chest pain or SOB. Denies hx of asthma.   History reviewed. No pertinent past medical history. Past Surgical History  Procedure Laterality Date  . Nasal septum surgery    . Vasectomy     History reviewed. No pertinent family history. Social History  Substance Use Topics  . Smoking status: Never Smoker   . Smokeless tobacco: None  . Alcohol Use: No    Review of Systems  Constitutional: Positive for fatigue. Negative for fever and chills.  HENT: Positive for congestion, postnasal drip, sore throat and voice change. Negative for ear pain and trouble swallowing.   Respiratory: Negative for cough and shortness of breath.   Cardiovascular: Negative for chest pain and palpitations.  Gastrointestinal: Negative for nausea, vomiting, abdominal pain and diarrhea.  Musculoskeletal: Positive for myalgias, back pain and neck pain. Negative for arthralgias and neck stiffness.  Skin: Negative for rash.  Neurological: Positive for headaches. Negative for dizziness and light-headedness.  All other systems reviewed and are negative.   Allergies   Prednisone  Home Medications   Prior to Admission medications   Medication Sig Start Date End Date Taking? Authorizing Provider  acetaminophen (TYLENOL) 325 MG tablet Take 650 mg by mouth every 6 (six) hours as needed for moderate pain.    Historical Provider, MD  diazepam (VALIUM) 5 MG tablet Take 5 mg by mouth every 6 (six) hours as needed for anxiety.    Historical Provider, MD  ibuprofen (ADVIL,MOTRIN) 200 MG tablet Take 400 mg by mouth every 6 (six) hours as needed for moderate pain.     Historical Provider, MD  metaxalone (SKELAXIN) 800 MG tablet Take 800 mg by mouth 3 (three) times daily as needed for muscle spasms.    Historical Provider, MD  oseltamivir (TAMIFLU) 75 MG capsule Take 1 capsule (75 mg total) by mouth every 12 (twelve) hours. For 5 days 10/19/15   Junius FinnerErin O'Malley, PA-C  promethazine (PHENERGAN) 25 MG tablet Take 1 tablet (25 mg total) by mouth every 6 (six) hours as needed for nausea or vomiting. 10/19/15   Junius FinnerErin O'Malley, PA-C   Meds Ordered and Administered this Visit  Medications - No data to display  BP 126/79 mmHg  Pulse 76  Temp(Src) 98.4 F (36.9 C) (Oral)  Resp 16  Ht 5\' 11"  (1.803 m)  Wt 179 lb (81.194 kg)  BMI 24.98 kg/m2  SpO2 100% No data found.   Physical Exam  Constitutional: He is oriented to person, place, and time. He appears well-developed and well-nourished.  HENT:  Head: Normocephalic and atraumatic.  Right Ear: Hearing, tympanic membrane, external ear and ear canal normal.  Left Ear: Hearing, tympanic membrane, external ear and ear canal normal.  Nose: Mucosal edema present.  Mouth/Throat: Uvula is midline and mucous membranes are normal. Posterior oropharyngeal erythema present. No oropharyngeal exudate, posterior oropharyngeal edema or tonsillar abscesses.  Eyes: Conjunctivae and EOM are normal. Pupils are equal, round, and reactive to light. No scleral icterus.  Neck: Normal range of motion. Neck supple.  No nuchal rigidity or meningeal  signs.  Cardiovascular: Normal rate, regular rhythm and normal heart sounds.   Pulmonary/Chest: Effort normal and breath sounds normal. No stridor. No respiratory distress. He has no wheezes. He has no rales. He exhibits no tenderness.  Abdominal: Soft. Bowel sounds are normal. He exhibits no distension and no mass. There is no tenderness. There is no rebound and no guarding.  Musculoskeletal: Normal range of motion.  Lymphadenopathy:    He has no cervical adenopathy.  Neurological: He is alert and oriented to person, place, and time. No cranial nerve deficit. Coordination normal.  Skin: Skin is warm and dry.  Nursing note and vitals reviewed.   ED Course  Procedures (including critical care time)  Labs Review Labs Reviewed - No data to display  Imaging Review No results found.     MDM   1. Flu-like symptoms   2. Exposure to the flu     Pt c/o flu-like symptoms with fatigue, body aches and nasal congestion that started yesterday.  Pt's son dx with influenza this weekend. Pt appears well, non-toxic, afebrile. No respiratory distress. No meningeal signs. Will tx empirically for influenza Discussed risks benefits of Tamiflu, pt would still like to be treated with Tamiflu. Rx: Tamiflu  BID for  5 days, phenergan prescribed for nausea   Advised pt to use acetaminophen and ibuprofen as needed for fever and pain. Encouraged rest and fluids. F/u with PCP in 7-10 days if not improving, sooner if worsening.  Pt verbalized understanding and agreement with tx plan.     Junius Finner, PA-C 10/19/15 1345

## 2015-10-19 NOTE — Discharge Instructions (Signed)
You may give Ibuprofen (Motrin) every 6-8 hours for fever and pain  °Alternate with Tylenol  °You may give Tylenol every 4-6 hours as needed for fever and pain  °Follow-up with your primary care provider next week for recheck of symptoms if not improving.  °Be sure your child drinks plenty of fluids and rest, at least 8hrs of sleep a night, preferably more while you are sick. °Return urgent care or go to closest ER if your child cannot keep down fluids/signs of dehydration, fever not reducing with Tylenol, difficulty breathing/wheezing, stiff neck, worsening condition, or other concerns (see below)  ° °

## 2015-10-19 NOTE — ED Notes (Signed)
Pt c/o post nasal drip, nasal congestion, and neck soreness x 1 day. Denies fever.

## 2015-12-30 ENCOUNTER — Encounter: Payer: Self-pay | Admitting: Sports Medicine

## 2015-12-30 ENCOUNTER — Ambulatory Visit (INDEPENDENT_AMBULATORY_CARE_PROVIDER_SITE_OTHER): Payer: PRIVATE HEALTH INSURANCE | Admitting: Sports Medicine

## 2015-12-30 DIAGNOSIS — M5412 Radiculopathy, cervical region: Secondary | ICD-10-CM

## 2015-12-30 MED ORDER — HYDROCODONE-ACETAMINOPHEN 5-325 MG PO TABS: 1.0000 | ORAL_TABLET | Freq: Three times a day (TID) | ORAL | Status: DC | PRN

## 2015-12-30 MED ORDER — PREDNISONE 50 MG PO TABS: ORAL_TABLET | ORAL | Status: DC

## 2015-12-30 MED ORDER — KETOROLAC TROMETHAMINE 30 MG/ML IJ SOLN
30.0000 mg | Freq: Once | INTRAMUSCULAR | Status: AC
  Administered 2015-12-30: 30 mg via INTRAMUSCULAR

## 2015-12-30 MED ORDER — CYCLOBENZAPRINE HCL 10 MG PO TABS: ORAL_TABLET | ORAL | Status: DC

## 2015-12-30 NOTE — Assessment & Plan Note (Signed)
Recurrence of right cervical radiculopathy with right periscapular spasm. Trigger point injections as above, prednisone, hydrocodone, Flexeril. Return to see me in one month. He is a candidate for another epidural however he did have a dural puncture and cerebrospinal fluid leak after his last one.  His last epidural did however provided fantastic relief of his radicular symptoms.

## 2015-12-30 NOTE — Progress Notes (Signed)
  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 He recovered well,and has done well for the past 5 months. Unfortunately for the past several days he's had increasing pain and spasm over his right trapezius. It's not precipitated by moving his shoulder and only better with laying flat at night.  No upper extremity paresthesias. No lower external me symptoms. Pain is severe, persistent.  Past medical history, Surgical history, Family history not pertinant except as noted below, Social history, Allergies, and medications have been entered into the medical record, reviewed, and no changes needed.   Review of Systems: No fevers, chills, night sweats, weight loss, chest pain, or shortness of breath.   Objective:    General: Well Developed, well nourished, and in no acute distress.  Neuro: Alert and oriented x3, extra-ocular muscles intact, sensation grossly intact.  HEENT: Normocephalic, atraumatic, pupils equal round reactive to light, neck supple, no masses, no lymphadenopathy, thyroid nonpalpable.  Skin: Warm and dry, no rashes. Cardiac: Regular rate and rhythm, no murmurs rubs or gallops, no lower extremity edema.  Respiratory: Clear to auscultation bilaterally. Not using accessory muscles, speaking in full sentences. Neck: Positive Spurling sign to the right with palpable painful trigger points along the right trapezius and rhomboids. Full neck range of motion Grip strength and sensation normal in bilateral hands Strength good C4 to T1 distribution No sensory change to C4 to T1 Reflexes normal  Procedure:  Injection of four right-sided trapezius and rhomboid trigger points Consent obtained and  verified. Time-out conducted. Noted no overlying erythema, induration, or other signs of local infection. Skin prepped in a sterile fashion. Topical analgesic spray: Ethyl chloride. Completed without difficulty. Meds:  Using a total of 1 mL kenalog 40, 2 mL lidocaine, 2 mL Marcaine I spread of the medication between the 4 painful trigger points. Pain immediately improved suggesting accurate placement of the medication. Advised to call if fevers/chills, erythema, induration, drainage, or persistent bleeding.  Impression and Recommendations:

## 2016-01-02 ENCOUNTER — Telehealth: Payer: Self-pay

## 2016-01-02 ENCOUNTER — Ambulatory Visit
Admission: RE | Admit: 2016-01-02 | Discharge: 2016-01-02 | Disposition: A | Discharge status: Home / Self Care | Payer: PRIVATE HEALTH INSURANCE | Source: Ambulatory Visit | Attending: Sports Medicine | Admitting: Sports Medicine

## 2016-01-02 DIAGNOSIS — M5412 Radiculopathy, cervical region: Secondary | ICD-10-CM

## 2016-01-02 MED ORDER — IOHEXOL 300 MG/ML  SOLN
1.0000 mL | Freq: Once | INTRAMUSCULAR | Status: AC | PRN
  Administered 2016-01-02: 1 mL via EPIDURAL

## 2016-01-02 MED ORDER — TRIAMCINOLONE ACETONIDE 40 MG/ML IJ SUSP (RADIOLOGY)
60.0000 mg | Freq: Once | INTRAMUSCULAR | Status: AC
  Administered 2016-01-02: 60 mg via EPIDURAL

## 2016-01-02 NOTE — Telephone Encounter (Signed)
Pt states he's still having extreme pain and can't get out of bed. Would like to have another epidural ordered, please assist. Thanks.

## 2016-01-02 NOTE — Telephone Encounter (Signed)
Epidural ordered, also placed referral to spine surgery simply for a consultation.

## 2016-01-02 NOTE — Telephone Encounter (Signed)
Left detailed message epidural was ordered and about the consults. Pt already has an appointment for 1:15 with [facility].

## 2016-01-02 NOTE — Discharge Instructions (Signed)

## 2016-01-24 ENCOUNTER — Telehealth: Payer: Self-pay | Admitting: Sports Medicine

## 2016-01-24 NOTE — Telephone Encounter (Signed)
As needed for the neck, but at some point need to do a routine wellness exam.

## 2016-01-24 NOTE — Telephone Encounter (Signed)
Pt called and wanted to make you aware that he will be having surgery on feb 27th and wanted to know when you would want to follow up with him.. Thanks

## 2016-01-27 ENCOUNTER — Ambulatory Visit: Payer: PRIVATE HEALTH INSURANCE | Admitting: Sports Medicine

## 2016-03-07 ENCOUNTER — Ambulatory Visit (INDEPENDENT_AMBULATORY_CARE_PROVIDER_SITE_OTHER): Payer: PRIVATE HEALTH INSURANCE | Admitting: Sports Medicine

## 2016-03-07 ENCOUNTER — Encounter: Payer: Self-pay | Admitting: Sports Medicine

## 2016-03-07 VITALS — BP 120/87 | HR 76 | Resp 18 | Wt 179.7 lb

## 2016-03-07 DIAGNOSIS — Z0000 Encounter for general adult medical examination without abnormal findings: Secondary | ICD-10-CM

## 2016-03-07 NOTE — Assessment & Plan Note (Signed)
Complete physical as above,patient will return for fasting blood work. Return in one year.

## 2016-03-07 NOTE — Progress Notes (Signed)
  Subjective:    CC: complete physical  HPI:  Physical exam: Healthy male, no complaints.  Cervical ACDF: Doing extremely well  Past medical history, Surgical history, Family history not pertinant except as noted below, Social history, Allergies, and medications have been entered into the medical record, reviewed, and no changes needed.   Review of Systems: No headache, visual changes, nausea, vomiting, diarrhea, constipation, dizziness, abdominal pain, skin rash, fevers, chills, night sweats, swollen lymph nodes, weight loss, chest pain, body aches, joint swelling, muscle aches, shortness of breath, mood changes, visual or auditory hallucinations.  Objective:    General: Well Developed, well nourished, and in no acute distress.  Neuro: Alert and oriented x3, extra-ocular muscles intact, sensation grossly intact. Cranial nerves II through XII are intact, motor, sensory, and coordinative functions are all intact. HEENT: Normocephalic, atraumatic, pupils equal round reactive to light, neck supple, no masses, no lymphadenopathy, thyroid nonpalpable. Oropharynx, nasopharynx, external ear canals are unremarkable. Skin: Warm and dry, no rashes noted.  Cardiac: Regular rate and rhythm, no murmurs rubs or gallops.  Respiratory: Clear to auscultation bilaterally. Not using accessory muscles, speaking in full sentences.  Abdominal: Soft, nontender, nondistended, positive bowel sounds, no masses, no organomegaly.  Musculoskeletal: Shoulder, elbow, wrist, hip, knee, ankle stable, and with full range of motion.  Impression and Recommendations:    The patient was counselled, risk factors were discussed, anticipatory guidance given.

## 2017-08-15 ENCOUNTER — Encounter: Payer: Self-pay | Admitting: *Deleted

## 2017-08-15 ENCOUNTER — Emergency Department (INDEPENDENT_AMBULATORY_CARE_PROVIDER_SITE_OTHER)
Admission: EM | Admit: 2017-08-15 | Discharge: 2017-08-15 | Disposition: A | Discharge status: Home / Self Care | Payer: PRIVATE HEALTH INSURANCE | Source: Home / Self Care | Attending: Family Medicine | Admitting: Family Medicine

## 2017-08-15 DIAGNOSIS — J029 Acute pharyngitis, unspecified: Secondary | ICD-10-CM | POA: Diagnosis not present

## 2017-08-15 LAB — POCT RAPID STREP A (OFFICE): RAPID STREP A SCREEN: NEGATIVE

## 2017-08-15 NOTE — ED Provider Notes (Signed)
[facility]    CSN: [id number] Arrival date & time: 08/15/17  1709     History   Chief Complaint Chief Complaint  Patient presents with  . Sore Throat  . Fever    HPI Joseph Gutierrez is a 41 y.o. male.   At 2:30pm yesterday patient developed fatigue, myalgias, and fever to 101.  Yesterday evening he developed a sore throat which has persisted today.  He is beginning to develop mild nasal congestion and cough.   His son has hand/foot/mouth disease, and he is concerned that he may be developing this also.  However, he does not have a rash or sores in his mouth.   The history is provided by the patient.    History reviewed. No pertinent past medical history.  Patient Active Problem List   Diagnosis Date Noted  . Annual physical exam 03/07/2016  . Radiculitis of right cervical region 05/17/2014    Past Surgical History:  Procedure Laterality Date  . ACDF    . NASAL SEPTUM SURGERY    . VASECTOMY         Home Medications    Prior to Admission medications   Not on File    Family History History reviewed. No pertinent family history.  Social History Social History  Substance Use Topics  . Smoking status: Never Smoker  . Smokeless tobacco: Never Used  . Alcohol use No     Allergies   Prednisone   Review of Systems Review of Systems + sore throat + cough No pleuritic pain No wheezing + nasal congestion + post-nasal drainage No sinus pain/pressure No itchy/red eyes No earache No hemoptysis No SOB + fever, + chills No nausea No vomiting No abdominal pain No diarrhea No urinary symptoms No skin rash + fatigue + myalgias + headache Used OTC meds without relief   Physical Exam Triage Vital Signs ED Triage Vitals  Enc Vitals Group     BP 08/15/17 1730 120/79     Pulse Rate 08/15/17 1730 76     Resp 08/15/17 1730 14     Temp 08/15/17 1730 98.8 F (37.1 C)     Temp Source 08/15/17 1730 Oral     SpO2 08/15/17 1730 99 %    Weight 08/15/17 1730 186 lb (84.4 kg)     Height --      Head Circumference --      Peak Flow --      Pain Score 08/15/17 1731 4     Pain Loc --      Pain Edu? --      Excl. in GC? --    No data found.   Updated Vital Signs BP 120/79 (BP Location: Left Arm)   Pulse 76   Temp 98.8 F (37.1 C) (Oral)   Resp 14   Wt 186 lb (84.4 kg)   SpO2 99%   BMI 25.94 kg/m   Visual Acuity Right Eye Distance:   Left Eye Distance:   Bilateral Distance:    Right Eye Near:   Left Eye Near:    Bilateral Near:     Physical Exam Nursing notes and Vital Signs reviewed. Appearance:  Patient appears stated age, and in no acute distress Eyes:  Pupils are equal, round, and reactive to light and accomodation.  Extraocular movement is intact.  Conjunctivae are not inflamed  Ears:  Canals normal.  Tympanic membranes normal.  Nose:  Mildly congested turbinates.  No sinus tenderness.   Pharynx:  Uvula slightly erythematous. Mouth:  No lesions present. Neck:  Supple.  Enlarged posterior/lateral nodes are palpated bilaterally, tender to palpation on the left.   Lungs:  Clear to auscultation.  Breath sounds are equal.  Moving air well. Heart:  Regular rate and rhythm without murmurs, rubs, or gallops.  Abdomen:  Nontender without masses or hepatosplenomegaly.  Bowel sounds are present.  No CVA or flank tenderness.  Extremities:  No edema.  Skin:  No rash present.    UC Treatments / Results  Labs (all labs ordered are listed, but only abnormal results are displayed) Labs Reviewed  STREP A DNA PROBE  POCT RAPID STREP A (OFFICE) negative    EKG  EKG Interpretation None       Radiology No results found.  Procedures Procedures (including critical care time)  Medications Ordered in UC Medications - No data to display   Initial Impression / Assessment and Plan / UC Course  I have reviewed the triage vital signs and the nursing notes.  Pertinent labs & imaging results that were  available during my care of the patient were reviewed by me and considered in my medical decision making (see chart for details).    Suspect early viral URI. There is no evidence of bacterial infection today.   Throat culture pending. Treat symptomatically for now: If increasing cold-like symptoms develop, try the following:  Take plain guaifenesin (1200mg  extended release tabs such as Mucinex) twice daily, with plenty of water, for cough and congestion.  May add Pseudoephedrine (30mg , one or two every 4 to 6 hours) for sinus congestion.  Get adequate rest.   May use Afrin nasal spray (or generic oxymetazoline) each morning for about 5 days and then discontinue.  Also recommend using saline nasal spray several times daily and saline nasal irrigation (AYR is a common brand).  Use Flonase nasal spray each morning after using Afrin nasal spray and saline nasal irrigation. Try warm salt water gargles for sore throat.  May take Delsym Cough Suppressant at bedtime for nighttime cough.  Stop all antihistamines for now, and other non-prescription cough/cold preparations. May take Ibuprofen 200mg , 4 tabs every 8 hours with food for sore throat, body aches, fever, etc.    Final Clinical Impressions(s) / UC Diagnoses   Final diagnoses:  Acute pharyngitis, unspecified etiology    New Prescriptions New Prescriptions   No medications on file         Lattie HawBeese, Tashara Suder A, MD 08/15/17 1758

## 2017-08-15 NOTE — Discharge Instructions (Signed)
If increasing cold-like symptoms develop, try the following:  Take plain guaifenesin (  extended release tabs such as Mucinex) twice daily, with plenty of water, for cough and congestion.  May add Pseudoephedrine ( , one or two every 4 to 6 hours) for sinus congestion.  Get adequate rest.   May use Afrin nasal spray (or generic oxymetazoline) each morning for about 5 days and then discontinue.  Also recommend using saline nasal spray several times daily and saline nasal irrigation (AYR is a common brand).  Use Flonase nasal spray each morning after using Afrin nasal spray and saline nasal irrigation. Try warm salt water gargles for sore throat.  May take Delsym Cough Suppressant at bedtime for nighttime cough.  Stop all antihistamines for now, and other non-prescription cough/cold preparations. May take Ibuprofen , 4 tabs every 8 hours with food for sore throat, body aches, fever, etc.

## 2017-08-15 NOTE — ED Triage Notes (Signed)
Patient c/o sore throat, fever and congestion x yesterday. T-max 102. Son with hand foot mouth and daughter with viral illness. Taken tylenol otc.

## 2017-08-16 ENCOUNTER — Telehealth: Payer: Self-pay | Admitting: *Deleted

## 2017-08-16 LAB — STREP A DNA PROBE: Group A Strep Probe: NOT DETECTED

## 2017-08-16 NOTE — Telephone Encounter (Signed)
LM with Tcx results and to call back if he has any questions or concerns. Clemens Catholichristy Izumi Mixon, LPN

## 2018-04-24 ENCOUNTER — Encounter: Payer: Self-pay | Admitting: Sports Medicine

## 2018-04-24 ENCOUNTER — Ambulatory Visit (INDEPENDENT_AMBULATORY_CARE_PROVIDER_SITE_OTHER): Payer: PRIVATE HEALTH INSURANCE | Admitting: Sports Medicine

## 2018-04-24 ENCOUNTER — Ambulatory Visit (INDEPENDENT_AMBULATORY_CARE_PROVIDER_SITE_OTHER): Payer: PRIVATE HEALTH INSURANCE

## 2018-04-24 DIAGNOSIS — M4322 Fusion of spine, cervical region: Secondary | ICD-10-CM

## 2018-04-24 DIAGNOSIS — M5412 Radiculopathy, cervical region: Secondary | ICD-10-CM

## 2018-04-24 DIAGNOSIS — M4802 Spinal stenosis, cervical region: Secondary | ICD-10-CM | POA: Diagnosis not present

## 2018-04-24 DIAGNOSIS — Z0000 Encounter for general adult medical examination without abnormal findings: Secondary | ICD-10-CM | POA: Diagnosis not present

## 2018-04-24 DIAGNOSIS — M25542 Pain in joints of left hand: Secondary | ICD-10-CM

## 2018-04-24 DIAGNOSIS — M25541 Pain in joints of right hand: Secondary | ICD-10-CM | POA: Diagnosis not present

## 2018-04-24 DIAGNOSIS — M7700 Medial epicondylitis, unspecified elbow: Secondary | ICD-10-CM

## 2018-04-24 DIAGNOSIS — M180 Bilateral primary osteoarthritis of first carpometacarpal joints: Secondary | ICD-10-CM

## 2018-04-24 DIAGNOSIS — M542 Cervicalgia: Secondary | ICD-10-CM | POA: Diagnosis not present

## 2018-04-24 MED ORDER — MELOXICAM 15 MG PO TABS: ORAL_TABLET | ORAL | 3 refills | Status: DC

## 2018-04-24 NOTE — Assessment & Plan Note (Signed)
Post ACDF, x-rays, repeat MRI, progressive weakness in both upper extremities. 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

## 2018-04-24 NOTE — Assessment & Plan Note (Signed)
Meloxicam, home rehab exercises, return in 1 month, injection if no better.

## 2018-04-24 NOTE — Progress Notes (Signed)
Subjective:    CC: elbow, shoulder and thumb pain  HPI: Joseph A.  Gutierrez, a 42yo male with pmh significant for C6/C7 cervical radiculitis; is presenting in clinic today with thumb pain (R>L); elbow pain (L>R); and shoulder pain (L>R).    Pt reports that thumb pain over the snuffbox initially began 6 months ago but spiked and progressively worsened starting 1 month ago.  Pt reports that there is pain when the thumb is hyperextended and pain with palpation at the metacarpal/carpal joint line .  Pt reports that he has attempted to use ice/heat to alleviate the pain but it has not made a difference.  Pt reports that the pain is 6/10 and is constant and throbbing in quality.    Pt reports that elbow pain (L >R) is currently 2-3/10 but can be unbearable when palpated at the medial epicondyl. Pt reports that elbow pain also began to flair about 1 month ago.    Pt reports that shoulder pain (L > R)  is over the deltoid and is tender to palpation. Pt also feels that his SCM is tight and pain is exacerbated with downward gaze and to the right.  Pt reports otc ibuprofen brought little relief and  that in the past he has not tolerated prednisone well (causes insomnia).     I reviewed the past medical history, family history, social history, surgical history, and allergies today and no changes were needed.  Please see the problem list section below in epic for further details.  Past Medical History: No past medical history on file. Past Surgical History: Past Surgical History:  Procedure Laterality Date  . ACDF    . NASAL SEPTUM SURGERY    . VASECTOMY     Social History: Social History   Socioeconomic History  . Marital status: Married    Spouse name: Not on file  . Number of children: Not on file  . Years of education: Not on file  . Highest education level: Not on file  Occupational History  . Not on file  Social Needs  . Financial resource strain: Not on file  . Food insecurity:   Worry: Not on file    Inability: Not on file  . Transportation needs:    Medical: Not on file    Non-medical: Not on file  Tobacco Use  . Smoking status: Never Smoker  . Smokeless tobacco: Never Used  Substance and Sexual Activity  . Alcohol use: No  . Drug use: Not on file  . Sexual activity: Not on file  Lifestyle  . Physical activity:    Days per week: Not on file    Minutes per session: Not on file  . Stress: Not on file  Relationships  . Social connections:    Talks on phone: Not on file    Gets together: Not on file    Attends religious service: Not on file    Active member of club or organization: Not on file    Attends meetings of clubs or organizations: Not on file    Relationship status: Not on file  Other Topics Concern  . Not on file  Social History Narrative  . Not on file   Family History: No family history on file. Allergies: Allergies  Allergen Reactions  . Prednisone Other (See Comments)    Hyperactivity for about 3 days   Medications: See med rec.  Review of Systems: No fevers, chills, night sweats, weight loss, chest pain, or shortness of breath.  Objective:    General: Well Developed, well nourished, and in no acute distress.  Neuro: Alert and oriented x3, extra-ocular muscles intact, sensation grossly intact.  HEENT: Normocephalic, atraumatic, pupils equal round reactive to light, neck supple, no masses, no lymphadenopathy, thyroid nonpalpable.  Skin: Warm and dry, no rashes. Cardiac: Regular rate and rhythm, no murmurs rubs or gallops, no lower extremity edema.  Respiratory: Clear to auscultation bilaterally. Not using accessory muscles, speaking in full sentences.   Neck: Inspection unremarkable. No palpable stepoffs. Negative Spurling's maneuver. Full neck range of motion Grip strength and sensation normal in bilateral hands Strength good C4 to T1 distribution No sensory change to C4 to T1 Negative Hoffman sign bilaterally Reflexes  normal (biceps, triceps, brachioradialis) ROM is full (flexion, extension, lateral rotation- left and right)  Left Shoulder: Inspection reveals no abnormalities, atrophy or asymmetry. Palpation is normal with no tenderness over AC joint or bicipital groove. Tenderness to palpation over deltoid.   Left Elbow: Unremarkable to inspection. Range of motion full pronation, supination, flexion, extension. Strength is full to all of the above directions Stable to varus, valgus stress. Negative moving valgus stress test; but pain over the medial epicondyl to valgus stress.  Discrete areas of tenderness to palpation over medial epicondyl . Ulnar nerve does not sublux.         Right Wrist: Inspection normal with no visible erythema or swelling. ROM smooth and normal with good flexion and extension and ulnar/radial deviation that is symmetrical with opposite wrist. Palpation is normal over  navicular, lunate, and TFCC; tendons without tenderness/ swelling;  Snuffbox tenderness; and tenderness to palpation over the dorsal surface of the 1st carpal- metacarpal Pain with palpation over the carpal-metacarpal joint line and referred pain to the theeminar eminance, especially with extension. No tenderness over Canal of Guyon. Strength 5/5 in all directions with pain when wrist is ulnarly deviated.  Negative Lourena Simmonds but Referred pain to the 1st extensor compartment when carpal-metacarpal joint line  During maneuver, when compartment iis  Palpated and during thumb extenion  Negative  tinel's and phalens. Negative Watson's test.    Impression and Recommendations:    Assessment-Osteoarthritis (Carpal/Metacarpal) Imaging of the Thumbs (XRAY);  Pt was prescribed antiinflammatory agent (meloxicam) to alleviate pain.  Pt was provided with educational handout on diagnosis and advised to complete rehabilitative exercises nightly for long term relief of pain, improvement on strength and maintenance of range  of motion. Pt was also advised to ice 3-4 x a day for approx 20 minutes; every day  Assessement -Medial Epicondylitis Pt was provided educational handout about his condition and advised to complete rehabilitative exercises nightly to maintain range of motion and strength and relieve pain long term.   Assessment-Cervical Radiculopathy (adjacent level disease) Imaging of the shoulder to further evaluate (Xray, and the MRI).    Pt is advised to follow up in one month   ___________________________________________ Ihor Austin. Benjamin Stain, M.D., ABFM., CAQSM. Primary Care and Sports Medicine  [facility]  Adjunct Instructor of Family Medicine  [employer]

## 2018-04-24 NOTE — Assessment & Plan Note (Signed)
Make a 1 month follow-up for physical exam, adding some routine labs in the meantime. We will do a Tdap at his physical

## 2018-04-24 NOTE — Assessment & Plan Note (Signed)
Bilateral x-rays, meloxicam, icing. Return in 1 month, injection if no better.

## 2018-04-25 ENCOUNTER — Telehealth: Payer: Self-pay | Admitting: Sports Medicine

## 2018-04-25 MED ORDER — DIAZEPAM 5 MG PO TABS: ORAL_TABLET | ORAL | 0 refills | Status: DC

## 2018-04-25 NOTE — Telephone Encounter (Signed)
Pt is set for MRI on Monday, will need premeds for claustrophobia.

## 2018-04-25 NOTE — Telephone Encounter (Signed)
Pt advised.

## 2018-04-25 NOTE — Telephone Encounter (Signed)
Prescription sent in for Valium, patient will need somebody to drive him if he uses Valium.

## 2018-04-28 ENCOUNTER — Ambulatory Visit (INDEPENDENT_AMBULATORY_CARE_PROVIDER_SITE_OTHER): Payer: PRIVATE HEALTH INSURANCE

## 2018-04-28 DIAGNOSIS — M2578 Osteophyte, vertebrae: Secondary | ICD-10-CM | POA: Diagnosis not present

## 2018-04-28 DIAGNOSIS — M4322 Fusion of spine, cervical region: Secondary | ICD-10-CM

## 2018-04-28 MED ORDER — GADOBENATE DIMEGLUMINE 529 MG/ML IV SOLN
17.0000 mL | Freq: Once | INTRAVENOUS | Status: AC | PRN
  Administered 2018-04-28: 17 mL via INTRAVENOUS

## 2018-05-01 ENCOUNTER — Ambulatory Visit (INDEPENDENT_AMBULATORY_CARE_PROVIDER_SITE_OTHER): Payer: PRIVATE HEALTH INSURANCE | Admitting: Sports Medicine

## 2018-05-01 DIAGNOSIS — M5412 Radiculopathy, cervical region: Secondary | ICD-10-CM | POA: Diagnosis not present

## 2018-05-01 MED ORDER — GABAPENTIN 300 MG PO CAPS: ORAL_CAPSULE | ORAL | 3 refills | Status: DC

## 2018-05-01 NOTE — Progress Notes (Signed)
Subjective:    CC: Follow-up  HPI: Joseph Gutierrez returns, he is post C6-C7 ACDF, more recently he has had increasing pain in his neck with radiation to the left periscapular region, and down to the thumbs.  Moderate, worsening.  He failed conservative measures so we obtained a new MRI looking for adjacent level disease, the MRI results will be dictated below.  Symptoms are worsening.  I reviewed the past medical history, family history, social history, surgical history, and allergies today and no changes were needed.  Please see the problem list section below in epic for further details.  Past Medical History: No past medical history on file. Past Surgical History: Past Surgical History:  Procedure Laterality Date  . ACDF    . NASAL SEPTUM SURGERY    . VASECTOMY     Social History: Social History   Socioeconomic History  . Marital status: Married    Spouse name: Not on file  . Number of children: Not on file  . Years of education: Not on file  . Highest education level: Not on file  Occupational History  . Not on file  Social Needs  . Financial resource strain: Not on file  . Food insecurity:    Worry: Not on file    Inability: Not on file  . Transportation needs:    Medical: Not on file    Non-medical: Not on file  Tobacco Use  . Smoking status: Never Smoker  . Smokeless tobacco: Never Used  Substance and Sexual Activity  . Alcohol use: No  . Drug use: Not on file  . Sexual activity: Not on file  Lifestyle  . Physical activity:    Days per week: Not on file    Minutes per session: Not on file  . Stress: Not on file  Relationships  . Social connections:    Talks on phone: Not on file    Gets together: Not on file    Attends religious service: Not on file    Active member of club or organization: Not on file    Attends meetings of clubs or organizations: Not on file    Relationship status: Not on file  Other Topics Concern  . Not on file  Social History  Narrative  . Not on file   Family History: No family history on file. Allergies: Allergies  Allergen Reactions  . Prednisone Other (See Comments)    Hyperactivity for about 3 days   Medications: See med rec.  Review of Systems: No fevers, chills, night sweats, weight loss, chest pain, or shortness of breath.   Objective:    General: Well Developed, well nourished, and in no acute distress.  Neuro: Alert and oriented x3, extra-ocular muscles intact, sensation grossly intact.  HEENT: Normocephalic, atraumatic, pupils equal round reactive to light, neck supple, no masses, no lymphadenopathy, thyroid nonpalpable.  Skin: Warm and dry, no rashes. Cardiac: Regular rate and rhythm, no murmurs rubs or gallops, no lower extremity edema.  Respiratory: Clear to auscultation bilaterally. Not using accessory muscles, speaking in full sentences.  MRI reviewed, good and solid fusion with good central canal space at the C6-C7 level, he does unfortunately have a larger broad-based disc protrusion at the C5-C6 level consistent with adjacent level disease.  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 This could explain his bilateral left worse than right C6 radicular  symptoms. We are not going to start with cervical epidural although I am going to order it, we are can start with gabapentin 1 tab at bedtime with an up taper. If this works over a week or 2 he can cancel his epidural. I would like to see him back in 1 month to see how things are going.  I spent 25 minutes with this patient, greater than 50% was face-to-face time counseling regarding the above diagnoses, I discussed the plan, prognosis and imaging results. ___________________________________________ Ihor Austin. Benjamin Stain, M.D., ABFM., CAQSM. Primary Care and Sports  [facility] [place] [facility]  Adjunct Instructor of Family Medicine  [employer]

## 2018-05-01 NOTE — Assessment & Plan Note (Signed)
There is some adjacent level disease at C5-C6 with a broad-based protrusion, mild central canal stenosis without foraminal stenosis. This could explain his bilateral left worse than right C6 radicular symptoms. We are not going to start with cervical epidural although I am going to order it, we are can start with gabapentin 1 tab at bedtime with an up taper. If this works over a week or 2 he can cancel his epidural. I would like to see him back in 1 month to see how things are going.

## 2018-05-16 ENCOUNTER — Ambulatory Visit
Admission: RE | Admit: 2018-05-16 | Discharge: 2018-05-16 | Disposition: A | Discharge status: Home / Self Care | Payer: PRIVATE HEALTH INSURANCE | Source: Ambulatory Visit | Attending: Sports Medicine | Admitting: Sports Medicine

## 2018-05-16 MED ORDER — TRIAMCINOLONE ACETONIDE 40 MG/ML IJ SUSP (RADIOLOGY)
60.0000 mg | Freq: Once | INTRAMUSCULAR | Status: AC
  Administered 2018-05-16: 60 mg via EPIDURAL

## 2018-05-16 MED ORDER — IOPAMIDOL (ISOVUE-M 300) INJECTION 61%
1.0000 mL | Freq: Once | INTRAMUSCULAR | Status: AC | PRN
  Administered 2018-05-16: 1 mL via EPIDURAL

## 2018-05-16 NOTE — Discharge Instructions (Signed)

## 2018-05-22 ENCOUNTER — Encounter: Payer: PRIVATE HEALTH INSURANCE | Admitting: Sports Medicine

## 2018-05-27 ENCOUNTER — Encounter: Payer: PRIVATE HEALTH INSURANCE | Admitting: Sports Medicine

## 2021-05-24 ENCOUNTER — Emergency Department (INDEPENDENT_AMBULATORY_CARE_PROVIDER_SITE_OTHER): Payer: PRIVATE HEALTH INSURANCE

## 2021-05-24 ENCOUNTER — Emergency Department (INDEPENDENT_AMBULATORY_CARE_PROVIDER_SITE_OTHER)
Admission: EM | Admit: 2021-05-24 | Discharge: 2021-05-24 | Disposition: A | Discharge status: Home / Self Care | Payer: PRIVATE HEALTH INSURANCE | Source: Home / Self Care

## 2021-05-24 ENCOUNTER — Encounter: Payer: Self-pay | Admitting: Emergency Medicine

## 2021-05-24 ENCOUNTER — Other Ambulatory Visit: Payer: Self-pay

## 2021-05-24 DIAGNOSIS — M25572 Pain in left ankle and joints of left foot: Secondary | ICD-10-CM

## 2021-05-24 DIAGNOSIS — S93402A Sprain of unspecified ligament of left ankle, initial encounter: Secondary | ICD-10-CM | POA: Diagnosis not present

## 2021-05-24 MED ORDER — ACETAMINOPHEN 325 MG PO TABS
650.0000 mg | ORAL_TABLET | Freq: Once | ORAL | Status: AC
  Administered 2021-05-24: 13:00:00 650 mg via ORAL

## 2021-05-24 NOTE — ED Provider Notes (Signed)
[facility]    CSN: [id number] Arrival date & time: 05/24/21  1256      History   Chief Complaint Chief Complaint  Patient presents with   Ankle Pain    HPI MUSA REWERTS is a 45 y.o. male.   Patient presents with concerns of left ankle injury. He reports today, around 12:40pm, he was stepping off a landing while at work and his left ankle turned inward. He states he heard a pop and has had pain to his left ankle since then. He reports pain at rest but worsening with movement and especially weight bearing. He has sprained that ankle before when he was younger but denies known fracture. He reports some tingling along the side of his foot. He has not taken anything for it.   The history is provided by the patient.  Ankle Pain Associated symptoms: no fatigue and no fever    History reviewed. No pertinent past medical history.  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 Authorizing Provider  gabapentin (NEURONTIN) 300 MG capsule One tab PO qHS for a week, then BID for a week, then TID. May double weekly to a max of 3,600mg /day 05/01/18   Monica Becton, MD  meloxicam (MOBIC) 15 MG tablet One tab PO qAM with breakfast for 2 weeks, then daily prn pain. 04/24/18   Monica Becton, MD    Family History History reviewed. No pertinent family history.  Social History Social History   Tobacco Use   Smoking status: Never   Smokeless tobacco: Never  Substance Use Topics   Alcohol use: No     Allergies   Prednisone   Review of  Systems Review of Systems  Constitutional:  Negative for fatigue and fever.  Musculoskeletal:  Positive for arthralgias and joint swelling.  Skin:  Negative for rash and wound.  Neurological:  Negative for weakness and numbness.    Physical Exam Triage Vital Signs ED Triage Vitals  Enc Vitals Group     BP 05/24/21 1316 123/85     Pulse Rate 05/24/21 1316 75     Resp --      Temp --      Temp src --      SpO2 05/24/21 1316 100 %     Weight --      Height --      Head Circumference --      Peak Flow --      Pain Score 05/24/21 1317 5     Pain Loc --      Pain Edu? --      Excl. in GC? --    No data found.  Updated Vital Signs BP 123/85 (BP Location: Right Arm)   Pulse 75   SpO2 100%   Visual Acuity Right Eye Distance:   Left Eye Distance:   Bilateral Distance:    Right Eye Near:   Left Eye Near:    Bilateral Near:     Physical Exam Vitals and nursing  note reviewed.  Constitutional:      General: He is not in acute distress. HENT:     Head: Normocephalic.  Eyes:     Pupils: Pupils are equal, round, and reactive to light.  Cardiovascular:     Pulses: Normal pulses.  Pulmonary:     Effort: Pulmonary effort is normal.  Musculoskeletal:     Left ankle: Swelling present. No ecchymosis. Tenderness present. Decreased range of motion.     Comments: Tenderness diffusely to left lateral, anterior, and medial ankle into lateral proximal foot. Mild decreased ROM with pain.   Neurological:     Mental Status: He is alert.     Gait: Gait abnormal (antalgic, favoring left).  Psychiatric:        Mood and Affect: Mood normal.     UC Treatments / Results  Labs (all labs ordered are listed, but only abnormal results are displayed) Labs Reviewed - No data to display  EKG   Radiology DG Ankle Complete Left  Result Date: 05/24/2021 CLINICAL DATA:  Pain following rolling type injury EXAM: LEFT ANKLE COMPLETE - 3+ VIEW COMPARISON:  None. FINDINGS: Frontal, oblique,  and lateral views were obtained. There is mild soft tissue swelling. No evident fracture or joint effusion. Joint spaces appear normal. No erosive change. Ankle mortise appears intact. IMPRESSION: No evident fracture or arthropathy.  Ankle mortise appears intact. Electronically Signed   By: Bretta Bang III M.D.   On: 05/24/2021 13:55    Procedures Procedures (including critical care time)  Medications Ordered in UC Medications  acetaminophen (TYLENOL) tablet 650 mg (650 mg Oral Given 05/24/21 1325)    Initial Impression / Assessment and Plan / UC Course  I have reviewed the triage vital signs and the nursing notes.  Pertinent labs & imaging results that were available during my care of the patient were reviewed by me and considered in my medical decision making (see chart for details).     Negative xray. Sx tx for ankle sprain.   E/M: 1 acute uncomplicated illness, 1 data (xray), low risk   Final Clinical Impressions(s) / UC Diagnoses   Final diagnoses:  Acute left ankle pain  Sprain of left ankle, unspecified ligament, initial encounter     Discharge Instructions      No fracture on x-ray. Rest, ice, and elevate. Take Tylenol and/or ibuprofen as needed for pain. Wear brace provided and refrain from worsening movements like running or jumping for a week, then gradually return to normal use as tolerated. If no improvement in 2 weeks follow up with orthopedics or foot/ankle specialist.       ED Prescriptions   None    PDMP not reviewed this encounter.   Estanislado Pandy, [place] 05/24/21 1414

## 2021-05-24 NOTE — ED Triage Notes (Signed)
Patient states that he stepped off of something at work and heard a loud pop with his left ankle.  Now it feels like something is floating in the back of his ankle.  Patient has not taken anything for pain.

## 2021-05-24 NOTE — Discharge Instructions (Signed)
No fracture on x-ray. Rest, ice, and elevate. Take Tylenol and/or ibuprofen as needed for pain. Wear brace provided and refrain from worsening movements like running or jumping for a week, then gradually return to normal use as tolerated. If no improvement in 2 weeks follow up with orthopedics or foot/ankle specialist.

## 2021-11-06 IMAGING — DX DG ANKLE COMPLETE 3+V*L*
3 series · 3 of 3 positions shown · non-contrast
Comparison: None.

CLINICAL DATA: Pain following rolling type injury

EXAM:
LEFT ANKLE COMPLETE - 3+ VIEW

[ankle ap]
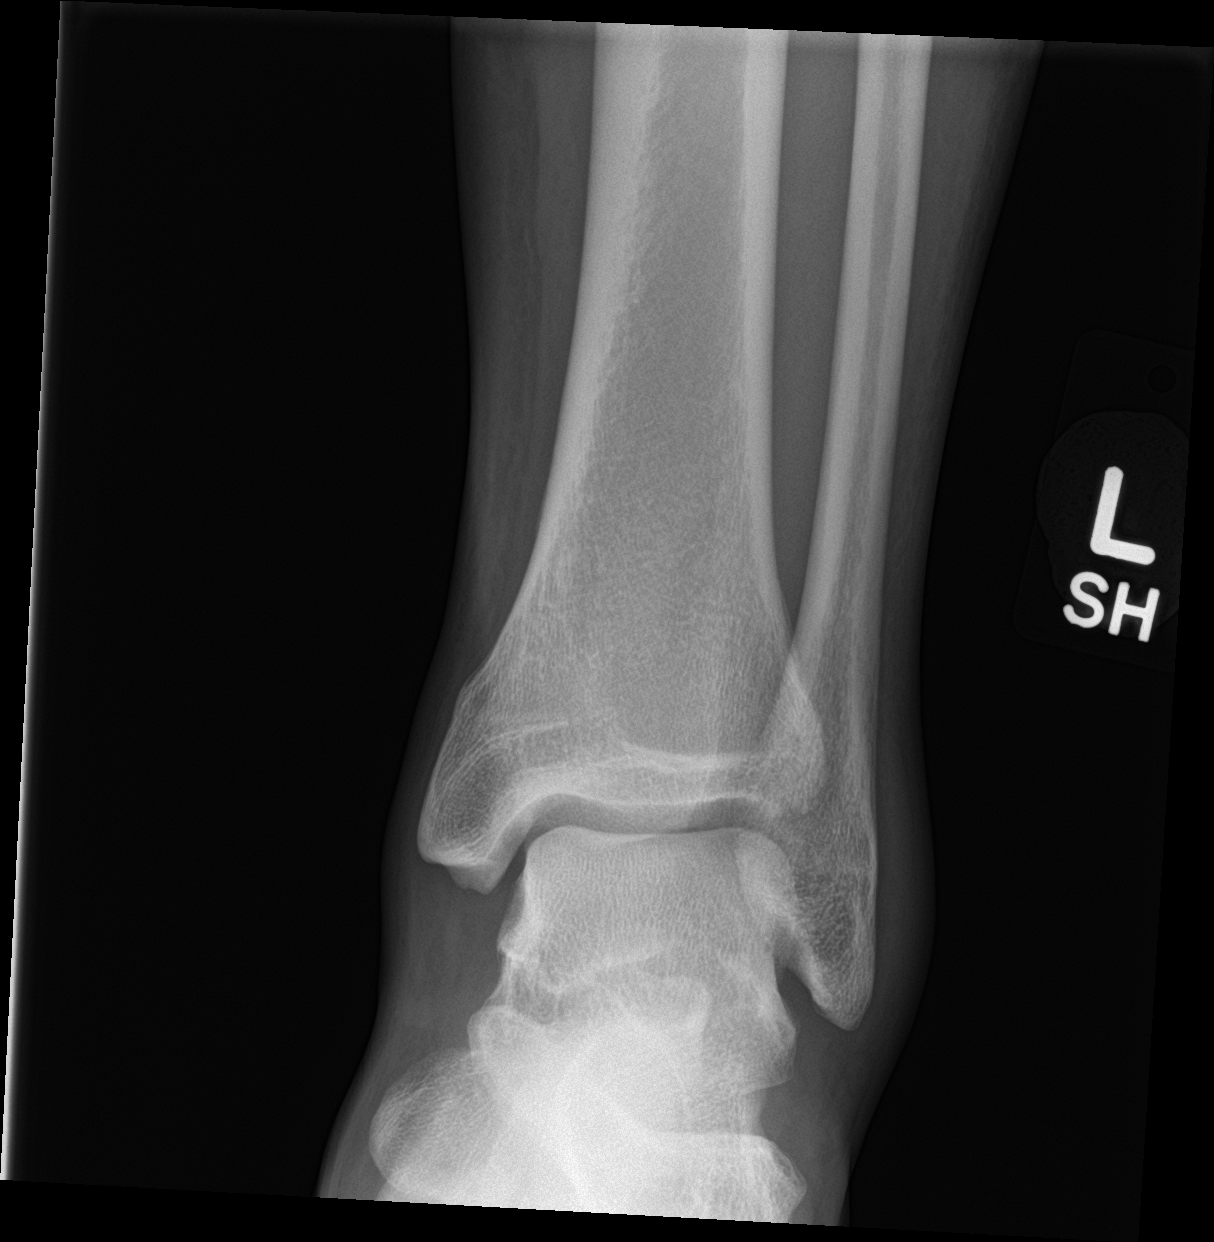

[ankle obl]
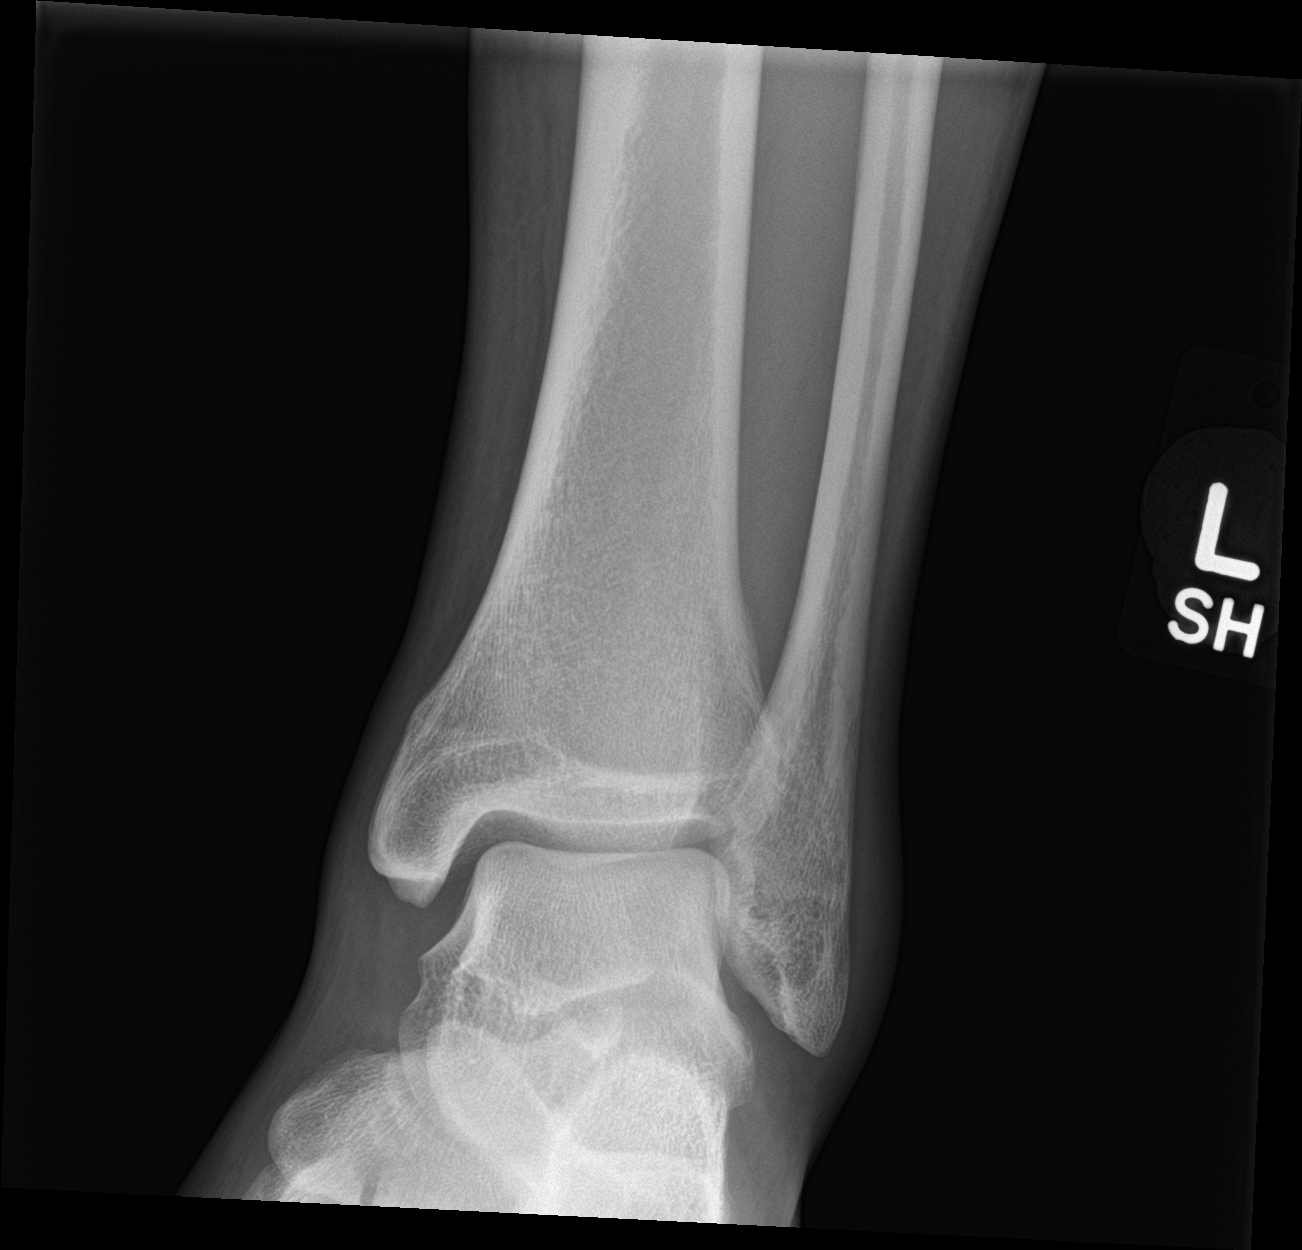

[ankle lat]
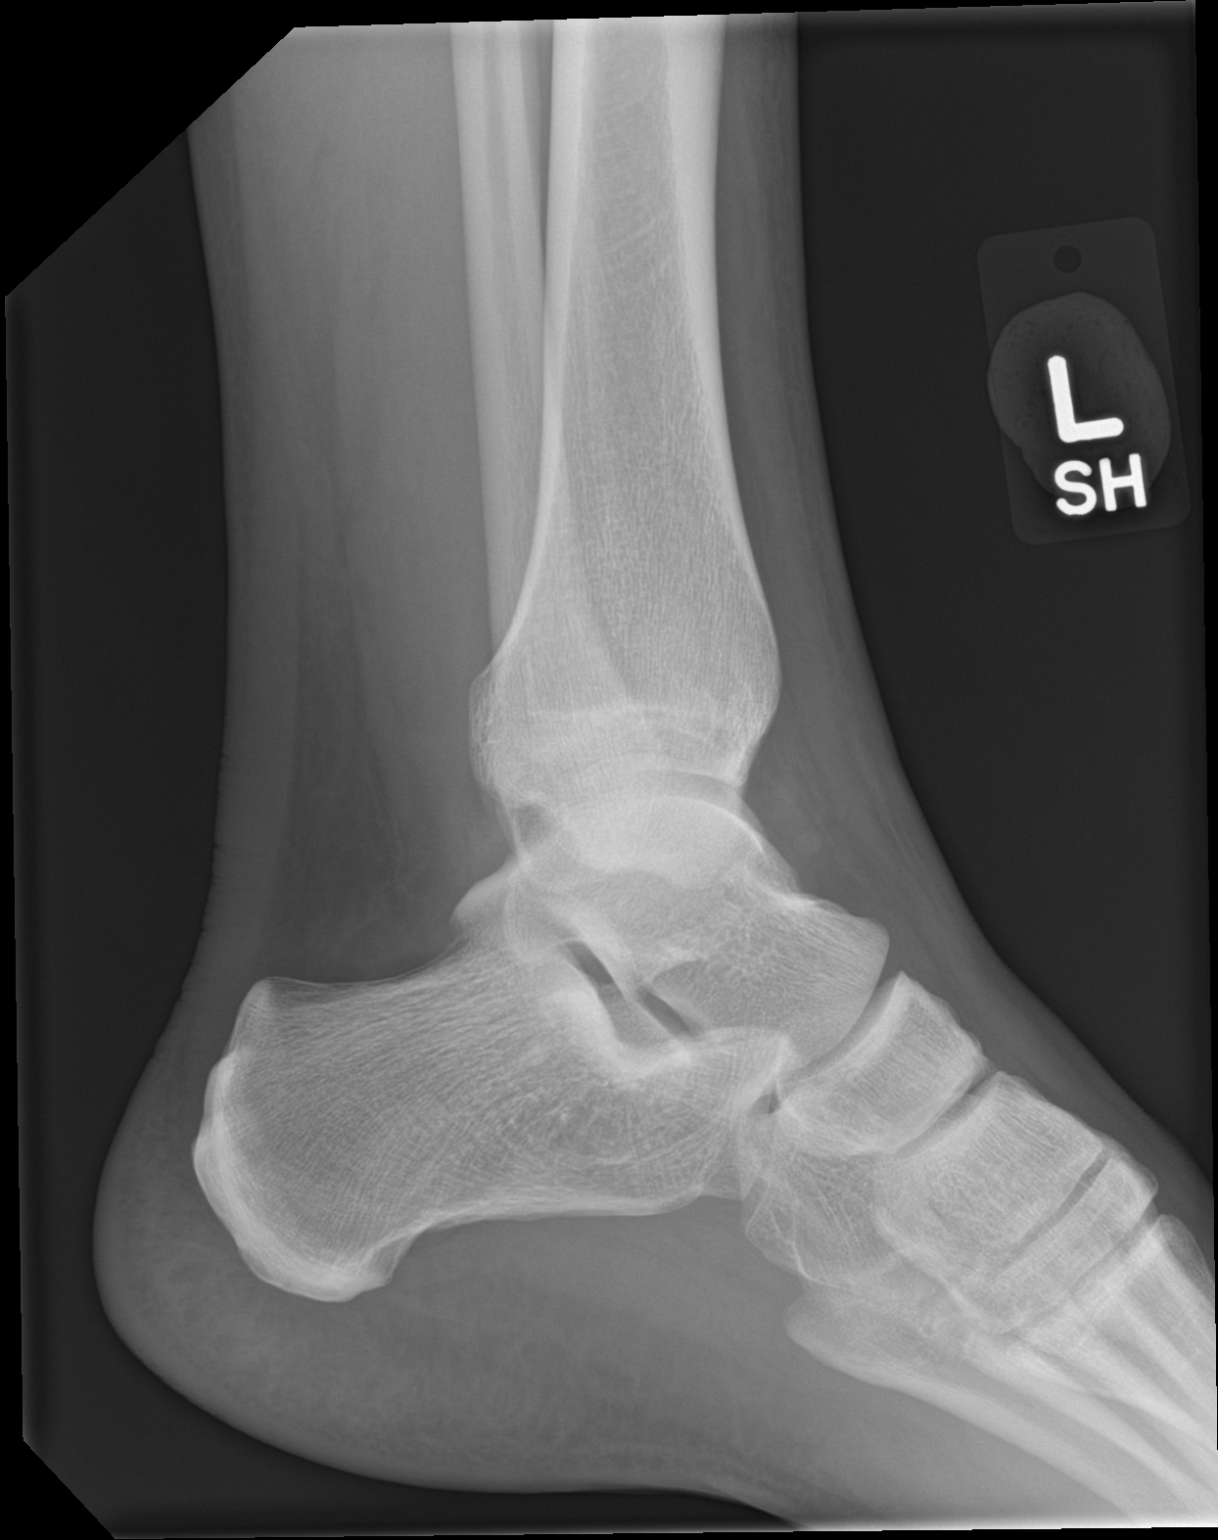

[3 of 3 positions shown; findings below may reference images not displayed]

FINDINGS: Frontal, oblique, and lateral views were obtained. There is mild
soft tissue swelling. No evident fracture or joint effusion. Joint
spaces appear normal. No erosive change. Ankle mortise appears
intact.
IMPRESSION: No evident fracture or arthropathy.  Ankle mortise appears intact.

## 2022-10-24 ENCOUNTER — Ambulatory Visit
Admission: EM | Admit: 2022-10-24 | Discharge: 2022-10-24 | Disposition: A | Discharge status: Home / Self Care | Payer: PRIVATE HEALTH INSURANCE | Attending: Family Medicine | Admitting: Family Medicine

## 2022-10-24 ENCOUNTER — Encounter: Payer: Self-pay | Admitting: Emergency Medicine

## 2022-10-24 DIAGNOSIS — K649 Unspecified hemorrhoids: Secondary | ICD-10-CM | POA: Diagnosis not present

## 2022-10-24 NOTE — ED Triage Notes (Signed)
Hx of a bleeding hemorrhoid  x 1 year  Intermittent bleeding  Worse today - soaked through his clothes today at work

## 2022-10-24 NOTE — ED Provider Notes (Signed)
[facility]    CSN: [id number] Arrival date & time: 10/24/22  1601      History   Chief Complaint Chief Complaint  Patient presents with   Hemorrhoids    HPI Joseph Gutierrez is a 46 y.o. male.   HPI  Patient states he has had a bleeding hemorrhoid for about a year.  It bothers him intermittently.  He does not have a history of constipation or diarrhea.  He states usually he will just get a streak of blood on the toilet tissue.  Sometimes an additional drip into the toilet.  Its never been very painful.  He is never gone for medical treatment.  Today, however he had painless bleeding while at work.  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  When he checked he had blood that went from his buttocks down to the upper thigh.  Bleeding is now stopped  History reviewed. No pertinent past medical history.  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 04/24/2018   Golfers elbow, bilateral 04/24/2018   Annual physical exam 03/07/2016   Radiculitis of right cervical region 05/17/2014    Past Surgical History:  Procedure Laterality Date   ACDF     NASAL SEPTUM SURGERY     VASECTOMY         Home Medications    Prior to Admission medications   Not on File    Family History Family History  Problem Relation Age of Onset   Healthy Mother    Healthy Father     Social History Social History   Tobacco Use   Smoking status: Never    Passive exposure: Never   Smokeless tobacco: Never  Vaping Use   Vaping Use: Never used  Substance Use Topics   Alcohol use: No   Drug use: Never     Allergies   Prednisone   Review of Systems Review of Systems See HPI  Physical Exam Triage Vital Signs ED Triage Vitals  Enc Vitals Group     BP 10/24/22 1612 133/89     Pulse Rate 10/24/22 1612 88     Resp  10/24/22 1612 16     Temp 10/24/22 1612 98.8 F (37.1 C)     Temp Source 10/24/22 1612 Oral     SpO2 10/24/22 1612 97 %     Weight 10/24/22 1614 200 lb (90.7 kg)     Height 10/24/22 1614 5\' 11"  (1.803 m)     Head Circumference --      Peak Flow --      Pain Score 10/24/22 1614 0     Pain Loc --      Pain Edu? --      Excl. in GC? --    No data found.  Updated Vital Signs BP 133/89 (BP Location: Right Arm)   Pulse 88   Temp 98.8 F (37.1 C) (Oral)   Resp 16   Ht 5\' 11"  (1.803 m)   Wt 90.7 kg   SpO2 97%   BMI 27.89 kg/m      Physical Exam Constitutional:      General: He is not in acute distress.    Appearance: He is well-developed.  HENT:     Head: Normocephalic and atraumatic.  Eyes:     Conjunctiva/sclera: Conjunctivae normal.  Pupils: Pupils are equal, round, and reactive to light.  Cardiovascular:     Rate and Rhythm: Normal rate.  Pulmonary:     Effort: Pulmonary effort is normal. No respiratory distress.  Abdominal:     General: There is no distension.     Palpations: Abdomen is soft.  Genitourinary:    Rectum: Normal.     Comments: There is a small external hemorrhoid with a 3 mm fissure that has stopped bleeding.  Otherwise normal Musculoskeletal:        General: Normal range of motion.     Cervical back: Normal range of motion.  Skin:    General: Skin is warm and dry.  Neurological:     Mental Status: He is alert.      UC Treatments / Results  Labs (all labs ordered are listed, but only abnormal results are displayed) Labs Reviewed - No data to display  EKG   Radiology No results found.  Procedures Procedures (including critical care time)  Medications Ordered in UC Medications - No data to display  Initial Impression / Assessment and Plan / UC Course  I have reviewed the triage vital signs and the nursing notes.  Pertinent labs & imaging results that were available during my care of the patient were reviewed by me and  considered in my medical decision making (see chart for details).      Final Clinical Impressions(s) / UC Diagnoses   Final diagnoses:  Bleeding hemorrhoid     Discharge Instructions      Take a fiber supplement or capsule.  Take this daily Drink lots of water May use an over-the-counter hemorrhoid cream if needed I have placed a referral to [facility] gastroenterology.  This office should call you by the end of the week. Follow-up with Dr. Benjamin Stain    ED Prescriptions   None    PDMP not reviewed this encounter.   Eustace Moore, MD 10/24/22 [phone]

## 2022-10-24 NOTE — Discharge Instructions (Signed)
Take a fiber supplement or capsule.  Take this daily Drink lots of water May use an over-the-counter hemorrhoid cream if needed I have placed a referral to [facility] gastroenterology.  This office should call you by the end of the week. Follow-up with Dr. Benjamin Stain

## 2022-10-25 ENCOUNTER — Telehealth: Payer: Self-pay

## 2022-10-25 NOTE — Telephone Encounter (Signed)
TC to f/u with pt after yesterday's visit to [facility]. He states he is doing okay and is still awaiting a call regarding the GI referral by Dr. Delton See yesterday. He states he is planning to call them today. No problems or questions to address at this time.

## 2022-11-29 ENCOUNTER — Encounter: Payer: Self-pay | Admitting: Family Medicine

## 2023-05-31 ENCOUNTER — Ambulatory Visit: Payer: PRIVATE HEALTH INSURANCE

## 2023-05-31 ENCOUNTER — Ambulatory Visit (INDEPENDENT_AMBULATORY_CARE_PROVIDER_SITE_OTHER): Payer: PRIVATE HEALTH INSURANCE | Admitting: Sports Medicine

## 2023-05-31 DIAGNOSIS — M7542 Impingement syndrome of left shoulder: Secondary | ICD-10-CM | POA: Diagnosis not present

## 2023-05-31 DIAGNOSIS — Z0000 Encounter for general adult medical examination without abnormal findings: Secondary | ICD-10-CM

## 2023-05-31 NOTE — Progress Notes (Signed)
  Subjective:    CC: Annual Physical Exam  HPI:  This patient is here for their annual physical  I reviewed the past medical history, family history, social history, surgical history, and allergies today and no changes were needed.  Please see the problem list section below in epic for further details.  Past Medical History: No past medical history on file. Past Surgical History: Past Surgical History:  Procedure Laterality Date   ACDF     NASAL SEPTUM SURGERY     VASECTOMY     Social History: Social History   Socioeconomic History   Marital status: Married    Spouse name: Not on file   Number of children: Not on file   Years of education: Not on file   Highest education level: Not on file  Occupational History   Not on file  Tobacco Use   Smoking status: Never    Passive exposure: Never   Smokeless tobacco: Never  Vaping Use   Vaping Use: Never used  Substance and Sexual Activity   Alcohol use: No   Drug use: Never   Sexual activity: Not on file  Other Topics Concern   Not on file  Social History Narrative   Not on file   Social Determinants of Health   Financial Resource Strain: Not on file  Food Insecurity: Not on file  Transportation Needs: Not on file  Physical Activity: Not on file  Stress: Not on file  Social Connections: Not on file   Family History: Family History  Problem Relation Age of Onset   Healthy Mother    Healthy Father    Allergies: Allergies  Allergen Reactions   Prednisone Other (See Comments)    Hyperactivity for about 3 days   Medications: See med rec.  Review of Systems: No headache, visual changes, nausea, vomiting, diarrhea, constipation, dizziness, abdominal pain, skin rash, fevers, chills, night sweats, swollen lymph nodes, weight loss, chest pain, body aches, joint swelling, muscle aches, shortness of breath, mood changes, visual or auditory hallucinations.  Objective:    General: Well Developed, well nourished, and  in no acute distress.  Neuro: Alert and oriented x3, extra-ocular muscles intact, sensation grossly intact. Cranial nerves II through XII are intact, motor, sensory, and coordinative functions are all intact. HEENT: Normocephalic, atraumatic, pupils equal round reactive to light, neck supple, no masses, no lymphadenopathy, thyroid nonpalpable. Oropharynx, nasopharynx, external ear canals are unremarkable. Skin: Warm and dry, no rashes noted.  Cardiac: Regular rate and rhythm, no murmurs rubs or gallops.  Respiratory: Clear to auscultation bilaterally. Not using accessory muscles, speaking in full sentences.  Abdominal: Soft, nontender, nondistended, positive bowel sounds, no masses, no organomegaly.  Musculoskeletal: Shoulder, elbow, wrist, hip, knee, ankle stable, and with full range of motion.  Impression and Recommendations:    The patient was counselled, risk factors were discussed, anticipatory guidance given.  Annual physical exam Annual physical as above, return to see me in 1 year for this. Getting fasting labs.  Impingement syndrome, shoulder, left Increasing pain localized anteriorly over the deltoid worse with abduction, positive impingement signs on exam, explained the anatomy and pathophysiology. Adding home conditioning, x-rays, return to see me in 6 weeks.   ____________________________________________ Ihor Austin. Benjamin Stain, M.D., ABFM., CAQSM., AME. Primary Care and Sports Medicine [facility]  Adjunct Professor of Family Medicine  [employer]  Restaurant manager, fast food

## 2023-05-31 NOTE — Assessment & Plan Note (Signed)
Annual physical as above, return to see me in 1 year for this. Getting fasting labs.

## 2023-05-31 NOTE — Assessment & Plan Note (Signed)
Increasing pain localized anteriorly over the deltoid worse with abduction, positive impingement signs on exam, explained the anatomy and pathophysiology. Adding home conditioning, x-rays, return to see me in 6 weeks.

## 2023-06-10 ENCOUNTER — Encounter: Payer: Self-pay | Admitting: Sports Medicine

## 2023-06-10 DIAGNOSIS — R5383 Other fatigue: Secondary | ICD-10-CM

## 2023-06-25 LAB — COMPREHENSIVE METABOLIC PANEL
AG Ratio: 1.9 (calc) (ref 1.0–2.5)
ALT: 20 U/L (ref 9–46)
AST: 19 U/L (ref 10–40)
Albumin: 4.4 g/dL (ref 3.6–5.1)
Alkaline phosphatase (APISO): 102 U/L (ref 36–130)
BUN: 12 mg/dL (ref 7–25)
CO2: 28 mmol/L (ref 20–32)
Calcium: 9.5 mg/dL (ref 8.6–10.3)
Chloride: 106 mmol/L (ref 98–110)
Creat: 0.92 mg/dL (ref 0.60–1.29)
Globulin: 2.3 g/dL (calc) (ref 1.9–3.7)
Glucose, Bld: 92 mg/dL (ref 65–99)
Potassium: 4.5 mmol/L (ref 3.5–5.3)
Sodium: 141 mmol/L (ref 135–146)
Total Bilirubin: 0.9 mg/dL (ref 0.2–1.2)
Total Protein: 6.7 g/dL (ref 6.1–8.1)

## 2023-06-25 LAB — HEMOGLOBIN A1C
Hgb A1c MFr Bld: 5.4 % of total Hgb (ref <5.7)
Mean Plasma Glucose: 108 mg/dL
eAG (mmol/L): 6 mmol/L

## 2023-06-25 LAB — TESTOSTERONE, FREE & TOTAL
Free Testosterone: 116.1 pg/mL (ref 35.0–155.0)
Testosterone, Total, LC-MS-MS: 965 ng/dL (ref 250–1100)

## 2023-06-25 LAB — CBC
HCT: 44.9 % (ref 38.5–50.0)
Hemoglobin: 15.1 g/dL (ref 13.2–17.1)
MCH: 31.5 pg (ref 27.0–33.0)
MCHC: 33.6 g/dL (ref 32.0–36.0)
MCV: 93.5 fL (ref 80.0–100.0)
MPV: 10.1 fL (ref 7.5–12.5)
Platelets: 318 10*3/uL (ref 140–400)
RBC: 4.8 10*6/uL (ref 4.20–5.80)
RDW: 12.6 % (ref 11.0–15.0)
WBC: 6.2 10*3/uL (ref 3.8–10.8)

## 2023-06-25 LAB — LIPID PANEL
Cholesterol: 162 mg/dL (ref <200)
HDL: 54 mg/dL (ref >=40)
LDL Cholesterol (Calc): 92 mg/dL (calc)
Non-HDL Cholesterol (Calc): 108 mg/dL (calc) (ref <130)
Total CHOL/HDL Ratio: 3 (calc) (ref <5.0)
Triglycerides: 73 mg/dL (ref <150)

## 2023-06-25 LAB — TSH: TSH: 3.18 mIU/L (ref 0.40–4.50)

## 2023-07-12 ENCOUNTER — Ambulatory Visit: Payer: PRIVATE HEALTH INSURANCE | Admitting: Sports Medicine

## 2024-08-11 ENCOUNTER — Encounter: Payer: Self-pay | Admitting: Sports Medicine
